# Patient Record
Sex: Female | Born: 1955 | Race: Black or African American | Hispanic: No | Marital: Single | Smoking: Never smoker
Health system: Southern US, Community
[De-identification: ages and names within clinical notes are randomized; demographics above are authoritative.]

## PROBLEM LIST (undated history)

## (undated) DIAGNOSIS — G4730 Sleep apnea, unspecified: Secondary | ICD-10-CM

## (undated) DIAGNOSIS — Z86718 Personal history of other venous thrombosis and embolism: Secondary | ICD-10-CM

## (undated) DIAGNOSIS — F32A Depression, unspecified: Secondary | ICD-10-CM

## (undated) DIAGNOSIS — C801 Malignant (primary) neoplasm, unspecified: Secondary | ICD-10-CM

## (undated) DIAGNOSIS — E785 Hyperlipidemia, unspecified: Secondary | ICD-10-CM

## (undated) DIAGNOSIS — M1990 Unspecified osteoarthritis, unspecified site: Secondary | ICD-10-CM

## (undated) DIAGNOSIS — K219 Gastro-esophageal reflux disease without esophagitis: Secondary | ICD-10-CM

## (undated) DIAGNOSIS — I10 Essential (primary) hypertension: Secondary | ICD-10-CM

## (undated) DIAGNOSIS — F329 Major depressive disorder, single episode, unspecified: Secondary | ICD-10-CM

## (undated) HISTORY — PX: CHOLECYSTECTOMY: SHX55

## (undated) HISTORY — DX: Essential (primary) hypertension: I10

## (undated) HISTORY — DX: Hyperlipidemia, unspecified: E78.5

## (undated) HISTORY — DX: Personal history of other venous thrombosis and embolism: Z86.718

## (undated) HISTORY — PX: BREAST BIOPSY: SHX20

## (undated) HISTORY — DX: Major depressive disorder, single episode, unspecified: F32.9

## (undated) HISTORY — PX: TONSILLECTOMY: SUR1361

## (undated) HISTORY — PX: TUBAL LIGATION: SHX77

## (undated) HISTORY — PX: PARTIAL HYSTERECTOMY: SHX80

## (undated) HISTORY — DX: Depression, unspecified: F32.A

## (undated) HISTORY — DX: Unspecified osteoarthritis, unspecified site: M19.90

---

## 2007-04-28 ENCOUNTER — Ambulatory Visit: Payer: Self-pay | Admitting: Nurse Practitioner

## 2007-05-03 ENCOUNTER — Ambulatory Visit: Payer: Self-pay | Admitting: *Deleted

## 2007-06-27 ENCOUNTER — Ambulatory Visit: Payer: Self-pay | Admitting: Nurse Practitioner

## 2007-07-13 ENCOUNTER — Ambulatory Visit: Payer: Self-pay | Admitting: Family Medicine

## 2007-08-02 ENCOUNTER — Ambulatory Visit: Payer: Self-pay | Admitting: Internal Medicine

## 2007-09-27 ENCOUNTER — Ambulatory Visit: Payer: Self-pay | Admitting: Nurse Practitioner

## 2007-10-11 ENCOUNTER — Ambulatory Visit: Payer: Self-pay | Admitting: Nurse Practitioner

## 2007-10-26 ENCOUNTER — Ambulatory Visit: Payer: Self-pay | Admitting: Nurse Practitioner

## 2007-11-08 ENCOUNTER — Encounter (INDEPENDENT_AMBULATORY_CARE_PROVIDER_SITE_OTHER): Payer: Self-pay | Admitting: Nurse Practitioner

## 2007-11-08 ENCOUNTER — Ambulatory Visit: Payer: Self-pay | Admitting: Family Medicine

## 2007-11-08 LAB — CONVERTED CEMR LAB
ALT: 26 units/L (ref 0–35)
AST: 27 units/L (ref 0–37)
Albumin: 4.1 g/dL (ref 3.5–5.2)
Alkaline Phosphatase: 71 units/L (ref 39–117)
Basophils Absolute: 0 10*3/uL (ref 0.0–0.1)
Basophils Relative: 1 % (ref 0–1)
Calcium: 9.1 mg/dL (ref 8.4–10.5)
Chloride: 106 meq/L (ref 96–112)
Creatinine, Ser: 0.86 mg/dL (ref 0.40–1.20)
MCHC: 32.7 g/dL (ref 30.0–36.0)
Neutro Abs: 2.1 10*3/uL (ref 1.7–7.7)
Neutrophils Relative %: 49 % (ref 43–77)
Potassium: 3.8 meq/L (ref 3.5–5.3)
RDW: 14.7 % (ref 11.5–15.5)

## 2007-12-14 ENCOUNTER — Ambulatory Visit: Payer: Self-pay | Admitting: Nurse Practitioner

## 2008-01-12 ENCOUNTER — Ambulatory Visit: Payer: Self-pay | Admitting: Family Medicine

## 2008-01-19 ENCOUNTER — Ambulatory Visit: Payer: Self-pay | Admitting: Family Medicine

## 2008-02-02 ENCOUNTER — Ambulatory Visit: Payer: Self-pay | Admitting: Internal Medicine

## 2008-02-23 ENCOUNTER — Ambulatory Visit: Payer: Self-pay | Admitting: Nurse Practitioner

## 2008-03-08 ENCOUNTER — Ambulatory Visit: Payer: Self-pay | Admitting: Internal Medicine

## 2008-04-04 ENCOUNTER — Ambulatory Visit: Payer: Self-pay | Admitting: Internal Medicine

## 2008-04-25 ENCOUNTER — Ambulatory Visit: Payer: Self-pay | Admitting: Internal Medicine

## 2008-05-23 ENCOUNTER — Ambulatory Visit: Payer: Self-pay | Admitting: Nurse Practitioner

## 2008-06-28 ENCOUNTER — Ambulatory Visit: Payer: Self-pay | Admitting: Internal Medicine

## 2008-07-16 ENCOUNTER — Ambulatory Visit: Payer: Self-pay | Admitting: Internal Medicine

## 2008-07-16 ENCOUNTER — Ambulatory Visit (HOSPITAL_COMMUNITY): Admission: RE | Admit: 2008-07-16 | Discharge: 2008-07-16 | Payer: Self-pay | Admitting: Internal Medicine

## 2008-07-26 ENCOUNTER — Ambulatory Visit: Payer: Self-pay | Admitting: Internal Medicine

## 2008-07-26 LAB — CONVERTED CEMR LAB: Prothrombin Time: 34.4 s — ABNORMAL HIGH (ref 11.6–15.2)

## 2008-08-23 ENCOUNTER — Ambulatory Visit: Payer: Self-pay | Admitting: Internal Medicine

## 2008-08-24 ENCOUNTER — Encounter (INDEPENDENT_AMBULATORY_CARE_PROVIDER_SITE_OTHER): Payer: Self-pay | Admitting: Internal Medicine

## 2008-08-24 LAB — CONVERTED CEMR LAB
INR: 3.4 — ABNORMAL HIGH (ref 0.0–1.5)
Prothrombin Time: 36.7 s — ABNORMAL HIGH (ref 11.6–15.2)

## 2008-09-05 ENCOUNTER — Ambulatory Visit: Payer: Self-pay | Admitting: Internal Medicine

## 2008-09-05 LAB — CONVERTED CEMR LAB: Prothrombin Time: 38 s — ABNORMAL HIGH (ref 11.6–15.2)

## 2008-09-14 ENCOUNTER — Ambulatory Visit: Payer: Self-pay | Admitting: Internal Medicine

## 2008-09-14 LAB — CONVERTED CEMR LAB: INR: 2.6 — ABNORMAL HIGH (ref 0.0–1.5)

## 2008-10-09 ENCOUNTER — Ambulatory Visit: Payer: Self-pay | Admitting: Family Medicine

## 2008-10-09 ENCOUNTER — Encounter (INDEPENDENT_AMBULATORY_CARE_PROVIDER_SITE_OTHER): Payer: Self-pay | Admitting: Internal Medicine

## 2008-10-23 ENCOUNTER — Ambulatory Visit: Payer: Self-pay | Admitting: Internal Medicine

## 2008-10-23 ENCOUNTER — Encounter (INDEPENDENT_AMBULATORY_CARE_PROVIDER_SITE_OTHER): Payer: Self-pay | Admitting: Internal Medicine

## 2008-10-23 LAB — CONVERTED CEMR LAB: INR: 1.8 — ABNORMAL HIGH (ref 0.0–1.5)

## 2008-11-06 ENCOUNTER — Ambulatory Visit: Payer: Self-pay | Admitting: Internal Medicine

## 2008-11-06 ENCOUNTER — Encounter (INDEPENDENT_AMBULATORY_CARE_PROVIDER_SITE_OTHER): Payer: Self-pay | Admitting: Internal Medicine

## 2008-11-06 LAB — CONVERTED CEMR LAB
INR: 1.8 — ABNORMAL HIGH (ref 0.0–1.5)
Prothrombin Time: 22.3 s — ABNORMAL HIGH (ref 11.6–15.2)

## 2008-11-20 ENCOUNTER — Encounter (INDEPENDENT_AMBULATORY_CARE_PROVIDER_SITE_OTHER): Payer: Self-pay | Admitting: Internal Medicine

## 2008-11-20 ENCOUNTER — Ambulatory Visit: Payer: Self-pay | Admitting: Internal Medicine

## 2008-11-20 LAB — CONVERTED CEMR LAB
INR: 3.3 — ABNORMAL HIGH (ref 0.0–1.5)
Prothrombin Time: 36.4 s — ABNORMAL HIGH (ref 11.6–15.2)

## 2008-12-20 ENCOUNTER — Ambulatory Visit: Payer: Self-pay | Admitting: Internal Medicine

## 2009-01-23 ENCOUNTER — Ambulatory Visit: Payer: Self-pay | Admitting: Internal Medicine

## 2009-02-20 ENCOUNTER — Ambulatory Visit: Payer: Self-pay | Admitting: Internal Medicine

## 2009-03-06 ENCOUNTER — Ambulatory Visit: Payer: Self-pay | Admitting: Internal Medicine

## 2009-03-21 ENCOUNTER — Ambulatory Visit: Payer: Self-pay | Admitting: Internal Medicine

## 2009-03-25 ENCOUNTER — Ambulatory Visit: Payer: Self-pay | Admitting: Internal Medicine

## 2009-03-25 ENCOUNTER — Encounter (INDEPENDENT_AMBULATORY_CARE_PROVIDER_SITE_OTHER): Payer: Self-pay | Admitting: Internal Medicine

## 2009-03-25 LAB — CONVERTED CEMR LAB
ALT: 27 units/L (ref 0–35)
AST: 28 units/L (ref 0–37)
Alkaline Phosphatase: 76 units/L (ref 39–117)
BUN: 21 mg/dL (ref 6–23)
Chloride: 103 meq/L (ref 96–112)
Creatinine, Ser: 0.84 mg/dL (ref 0.40–1.20)
Total Bilirubin: 0.4 mg/dL (ref 0.3–1.2)

## 2009-04-02 ENCOUNTER — Ambulatory Visit (HOSPITAL_COMMUNITY): Admission: RE | Admit: 2009-04-02 | Discharge: 2009-04-02 | Payer: Self-pay | Admitting: Family Medicine

## 2009-04-22 ENCOUNTER — Ambulatory Visit: Payer: Self-pay | Admitting: Internal Medicine

## 2009-04-25 ENCOUNTER — Encounter: Payer: Self-pay | Admitting: Internal Medicine

## 2009-04-25 ENCOUNTER — Ambulatory Visit: Payer: Self-pay | Admitting: Internal Medicine

## 2009-04-25 DIAGNOSIS — Z86718 Personal history of other venous thrombosis and embolism: Secondary | ICD-10-CM

## 2009-04-25 DIAGNOSIS — M545 Low back pain: Secondary | ICD-10-CM

## 2009-04-25 DIAGNOSIS — I10 Essential (primary) hypertension: Secondary | ICD-10-CM | POA: Insufficient documentation

## 2009-05-09 ENCOUNTER — Ambulatory Visit: Payer: Self-pay | Admitting: Internal Medicine

## 2009-07-01 ENCOUNTER — Ambulatory Visit: Payer: Self-pay | Admitting: Internal Medicine

## 2009-07-22 ENCOUNTER — Ambulatory Visit: Payer: Self-pay | Admitting: Internal Medicine

## 2009-08-26 ENCOUNTER — Ambulatory Visit: Payer: Self-pay | Admitting: Internal Medicine

## 2010-12-01 ENCOUNTER — Other Ambulatory Visit
Admission: RE | Admit: 2010-12-01 | Discharge: 2010-12-01 | Payer: Self-pay | Source: Home / Self Care | Admitting: Internal Medicine

## 2010-12-31 ENCOUNTER — Encounter
Admission: RE | Admit: 2010-12-31 | Discharge: 2010-12-31 | Payer: Self-pay | Source: Home / Self Care | Attending: Internal Medicine | Admitting: Internal Medicine

## 2011-01-07 ENCOUNTER — Encounter
Admission: RE | Admit: 2011-01-07 | Discharge: 2011-01-07 | Payer: Self-pay | Source: Home / Self Care | Attending: Internal Medicine | Admitting: Internal Medicine

## 2012-11-01 ENCOUNTER — Other Ambulatory Visit: Payer: Self-pay | Admitting: Internal Medicine

## 2012-11-01 DIAGNOSIS — R921 Mammographic calcification found on diagnostic imaging of breast: Secondary | ICD-10-CM

## 2012-11-28 ENCOUNTER — Other Ambulatory Visit: Payer: Self-pay | Admitting: Internal Medicine

## 2012-11-28 ENCOUNTER — Ambulatory Visit
Admission: RE | Admit: 2012-11-28 | Discharge: 2012-11-28 | Disposition: A | Discharge status: Home / Self Care | Payer: BC Managed Care – PPO | Source: Ambulatory Visit | Attending: Internal Medicine | Admitting: Internal Medicine

## 2012-11-28 DIAGNOSIS — R921 Mammographic calcification found on diagnostic imaging of breast: Secondary | ICD-10-CM

## 2012-12-05 ENCOUNTER — Other Ambulatory Visit: Payer: Self-pay | Admitting: Diagnostic Radiology

## 2012-12-05 ENCOUNTER — Ambulatory Visit
Admission: RE | Admit: 2012-12-05 | Discharge: 2012-12-05 | Disposition: A | Discharge status: Home / Self Care | Payer: BC Managed Care – PPO | Source: Ambulatory Visit | Attending: Internal Medicine | Admitting: Internal Medicine

## 2012-12-05 DIAGNOSIS — R921 Mammographic calcification found on diagnostic imaging of breast: Secondary | ICD-10-CM

## 2013-03-15 ENCOUNTER — Other Ambulatory Visit: Payer: Self-pay | Admitting: Occupational Medicine

## 2013-03-15 ENCOUNTER — Ambulatory Visit: Payer: Self-pay

## 2013-03-15 DIAGNOSIS — R52 Pain, unspecified: Secondary | ICD-10-CM

## 2013-08-31 ENCOUNTER — Other Ambulatory Visit (HOSPITAL_COMMUNITY): Payer: Self-pay | Admitting: Orthopedic Surgery

## 2013-08-31 ENCOUNTER — Ambulatory Visit (HOSPITAL_COMMUNITY)
Admission: RE | Admit: 2013-08-31 | Discharge: 2013-08-31 | Disposition: A | Discharge status: Home / Self Care | Payer: BC Managed Care – PPO | Source: Ambulatory Visit | Attending: Orthopedic Surgery | Admitting: Orthopedic Surgery

## 2013-08-31 DIAGNOSIS — M25512 Pain in left shoulder: Secondary | ICD-10-CM

## 2013-08-31 DIAGNOSIS — Z1389 Encounter for screening for other disorder: Secondary | ICD-10-CM | POA: Insufficient documentation

## 2013-08-31 DIAGNOSIS — M795 Residual foreign body in soft tissue: Secondary | ICD-10-CM | POA: Insufficient documentation

## 2013-11-03 ENCOUNTER — Other Ambulatory Visit: Payer: Self-pay

## 2013-11-03 DIAGNOSIS — Z1231 Encounter for screening mammogram for malignant neoplasm of breast: Secondary | ICD-10-CM

## 2013-11-30 ENCOUNTER — Ambulatory Visit: Payer: Self-pay

## 2014-01-01 ENCOUNTER — Ambulatory Visit
Admission: RE | Admit: 2014-01-01 | Discharge: 2014-01-01 | Disposition: A | Discharge status: Home / Self Care | Payer: Self-pay | Source: Ambulatory Visit

## 2014-01-01 DIAGNOSIS — Z1231 Encounter for screening mammogram for malignant neoplasm of breast: Secondary | ICD-10-CM

## 2014-08-22 ENCOUNTER — Other Ambulatory Visit: Payer: Self-pay | Admitting: Orthopedic Surgery

## 2014-08-22 DIAGNOSIS — R222 Localized swelling, mass and lump, trunk: Secondary | ICD-10-CM

## 2014-08-28 ENCOUNTER — Other Ambulatory Visit: Payer: Self-pay

## 2014-08-28 ENCOUNTER — Ambulatory Visit
Admission: RE | Admit: 2014-08-28 | Discharge: 2014-08-28 | Disposition: A | Discharge status: Home / Self Care | Payer: BC Managed Care – PPO | Source: Ambulatory Visit | Attending: Orthopedic Surgery | Admitting: Orthopedic Surgery

## 2014-08-28 DIAGNOSIS — R222 Localized swelling, mass and lump, trunk: Secondary | ICD-10-CM

## 2014-08-28 MED ORDER — IOHEXOL 300 MG/ML  SOLN
75.0000 mL | Freq: Once | INTRAMUSCULAR | Status: AC | PRN
  Administered 2014-08-28: 75 mL via INTRAVENOUS

## 2015-03-15 ENCOUNTER — Ambulatory Visit (INDEPENDENT_AMBULATORY_CARE_PROVIDER_SITE_OTHER): Payer: 59 | Admitting: Psychiatry

## 2015-03-15 ENCOUNTER — Encounter (HOSPITAL_COMMUNITY): Payer: Self-pay | Admitting: Psychiatry

## 2015-03-15 ENCOUNTER — Encounter (INDEPENDENT_AMBULATORY_CARE_PROVIDER_SITE_OTHER): Payer: Self-pay

## 2015-03-15 VITALS — BP 134/82 | HR 76 | Ht 66.0 in | Wt 243.0 lb

## 2015-03-15 DIAGNOSIS — F331 Major depressive disorder, recurrent, moderate: Secondary | ICD-10-CM
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
## 2015-03-18 ENCOUNTER — Telehealth (HOSPITAL_COMMUNITY): Payer: Self-pay

## 2015-03-18 ENCOUNTER — Other Ambulatory Visit (HOSPITAL_COMMUNITY): Payer: Self-pay | Admitting: Psychiatry

## 2015-03-18 DIAGNOSIS — F331 Major depressive disorder, recurrent, moderate: Secondary | ICD-10-CM

## 2015-03-18 MED ORDER — BUPROPION HCL ER (XL) 300 MG PO TB24: 300.0000 mg | ORAL_TABLET | ORAL | Status: DC

## 2015-04-04 ENCOUNTER — Encounter (HOSPITAL_COMMUNITY): Payer: Self-pay | Admitting: Psychiatry

## 2015-04-04 ENCOUNTER — Ambulatory Visit (INDEPENDENT_AMBULATORY_CARE_PROVIDER_SITE_OTHER): Payer: 59 | Admitting: Psychiatry

## 2015-04-04 VITALS — BP 134/88 | HR 98 | Ht 66.5 in | Wt 240.4 lb

## 2015-04-04 DIAGNOSIS — F331 Major depressive disorder, recurrent, moderate: Secondary | ICD-10-CM | POA: Diagnosis not present

## 2015-04-04 MED ORDER — CITALOPRAM HYDROBROMIDE 20 MG PO TABS: 20.0000 mg | ORAL_TABLET | Freq: Every day | ORAL | Status: DC

## 2015-04-04 MED ORDER — BUPROPION HCL ER (XL) 300 MG PO TB24: 300.0000 mg | ORAL_TABLET | ORAL | Status: DC
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
## 2015-04-30 ENCOUNTER — Encounter (HOSPITAL_COMMUNITY): Payer: Self-pay | Admitting: Clinical

## 2015-04-30 ENCOUNTER — Ambulatory Visit (INDEPENDENT_AMBULATORY_CARE_PROVIDER_SITE_OTHER): Payer: 59 | Admitting: Clinical

## 2015-04-30 DIAGNOSIS — F331 Major depressive disorder, recurrent, moderate: Secondary | ICD-10-CM
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
## 2015-05-16 ENCOUNTER — Telehealth (HOSPITAL_COMMUNITY): Payer: Self-pay | Admitting: *Deleted

## 2015-05-16 NOTE — Telephone Encounter (Signed)
Pt left message on nurses vm requesting refill.  Called pt back left message on her vm for call back.   Pt should have enough medications.

## 2015-05-17 ENCOUNTER — Telehealth (HOSPITAL_COMMUNITY): Payer: Self-pay

## 2015-05-17 NOTE — Telephone Encounter (Signed)
Telephone call with patient to follow up on message she left stating she needed a refill of Wellbutrin.  Patient reported she called her pharmacy and had a refill there on file.  Patient reminded of next appointment with Dr. Adele Schilder set for 06/05/15.

## 2015-05-20 ENCOUNTER — Ambulatory Visit (INDEPENDENT_AMBULATORY_CARE_PROVIDER_SITE_OTHER): Payer: 59 | Admitting: Clinical

## 2015-05-20 DIAGNOSIS — F331 Major depressive disorder, recurrent, moderate: Secondary | ICD-10-CM

## 2015-05-23 ENCOUNTER — Encounter (HOSPITAL_COMMUNITY): Payer: Self-pay | Admitting: Clinical

## 2015-05-26 NOTE — Progress Notes (Signed)
   THERAPIST PROGRESS NOTE  Session Time: 1:32 -2:28  Participation Level: Active  Behavioral Response: CasualAlertDepressed  Type of Therapy: Individual Therapy  Treatment Goals addressed: Improve psychiatric symptoms  Interventions: Motivational Interviewing and Mindfulness and Grounding techniques  Summary: Helen Hess is a 59 y.o. female who presents with Major depressive disorder, recurrent episode, moderate.   Suicidal/Homicidal: Nowithout intent/plan  Therapist Response:  Khadeeja met with clinician for an individual session. [name] Discussed her psychiatric symptoms. [name] shared that she has been tearful and sad most of the time since we last met. 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 She shared that she missed being around the children and hearing how they appreciated her. She shared how she had felt like she was helpful to them because she treated them with positive regard. She shared how she knew that a lot of them didn't get that very much. She cried when she shared that she was concerned that they would be missing that. Client and clinician discussed the idea of volunteering somewhere she could have that positive interaction with kids. Client stated that she didn't think she was ready.   [name] shared that she has isolated herself. She shared that she has not been around her children ( her own or those she had raised) or her grandchildren because she is afraid that they will worry about her if they saw her like this. She shared that she missed them a lot but didn't want to interfere in their lives. Clinician asked open ended questions about what it means to give support and to be supported. Clinician encouraged her to reach out to them.Clinician shared that interacting with others, especially those who love her would likely be helpful to her mental  health. Clinician introduced grounding and mindfulness techniques. Client and clinician practiced one together. [name] agreed to practice the technique until next session.  Plan: Return again in 1 weeks.  Diagnosis: Axis I: Major depressive disorder, recurrent episode, moderate.         [name] A, LCSW 05/26/2015

## 2015-05-27 ENCOUNTER — Ambulatory Visit (INDEPENDENT_AMBULATORY_CARE_PROVIDER_SITE_OTHER): Payer: BC Managed Care – PPO | Admitting: Clinical

## 2015-05-27 ENCOUNTER — Encounter (HOSPITAL_COMMUNITY): Payer: Self-pay | Admitting: Clinical

## 2015-05-27 DIAGNOSIS — F331 Major depressive disorder, recurrent, moderate: Secondary | ICD-10-CM

## 2015-06-03 ENCOUNTER — Ambulatory Visit (INDEPENDENT_AMBULATORY_CARE_PROVIDER_SITE_OTHER): Payer: 59 | Admitting: Clinical

## 2015-06-03 ENCOUNTER — Encounter (HOSPITAL_COMMUNITY): Payer: Self-pay

## 2015-06-03 ENCOUNTER — Encounter (HOSPITAL_COMMUNITY): Payer: Self-pay | Admitting: Clinical

## 2015-06-03 DIAGNOSIS — F331 Major depressive disorder, recurrent, moderate: Secondary | ICD-10-CM | POA: Diagnosis not present
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
## 2015-06-05 ENCOUNTER — Ambulatory Visit (HOSPITAL_COMMUNITY): Payer: Self-pay | Admitting: Psychiatry

## 2015-06-10 ENCOUNTER — Ambulatory Visit (INDEPENDENT_AMBULATORY_CARE_PROVIDER_SITE_OTHER): Payer: 59 | Admitting: Clinical

## 2015-06-10 ENCOUNTER — Encounter (HOSPITAL_COMMUNITY): Payer: Self-pay | Admitting: Clinical

## 2015-06-10 DIAGNOSIS — F331 Major depressive disorder, recurrent, moderate: Secondary | ICD-10-CM | POA: Diagnosis not present

## 2015-06-10 NOTE — Progress Notes (Signed)
THERAPIST PROGRESS NOTE  Session Time: 12:06 - 1:00  Participation Level: Active  Behavioral Response: CasualAlertDepressed  Type of Therapy: Individual Therapy  Treatment Goals addressed: improve psychiatric symptoms, improve unhelpful thinking, elevate mood, more positive outlook  Interventions: Cbt, Motivational Interviewing, Grounding and mindfulness techniques  Summary: Helen Hess is a 59 y.o. female who presents with Major depressive disorder, recurrent episode, moderate. .   Suicidal/Homicidal: No without intent/plan  Therapist Response: Khalessi met with clinician for an individual session. She discussed her current life events and her psychiatric symptoms. [name] shared that she continues to isolate. She shared that others go to the store for her. She shared that she finds it difficult to do much of anything. She shared that when it is not the depression it is the physical pain. [name] and clinician discussed her isolation and how it might be affecting her mood. [name] shared about her difficulty both in thought and body. [name] and clinician discussed grounding techniques. [name] was to practice this past week but she reported that she forgot. Client and clinician discussed the techniques and then practiced some of them. [name] shared some of her frustrating thoughts and client and clinician practiced another technique. Client and clinician discussed the practice, the purpose, and application. [name] said she would try to practice this week. Clinician made a goofy comment and [name] laugh and she smiled for the first time and said that it felt good to laugh.   Plan: Return again in 1 weeks.  Diagnosis:     Axis I: Major depressive disorder, recurrent episode, moderate.    Powell,Frances A, LCSW 06/10/2015    

## 2015-06-14 ENCOUNTER — Ambulatory Visit (HOSPITAL_COMMUNITY): Payer: Self-pay | Admitting: Psychiatry

## 2015-06-25 NOTE — Progress Notes (Signed)
Opened in Error.

## 2015-06-28 ENCOUNTER — Encounter (HOSPITAL_COMMUNITY): Payer: Self-pay | Admitting: Emergency Medicine

## 2015-06-28 ENCOUNTER — Emergency Department (HOSPITAL_COMMUNITY)
Admission: EM | Admit: 2015-06-28 | Discharge: 2015-06-28 | Disposition: A | Discharge status: Home / Self Care | Payer: 59 | Attending: Emergency Medicine | Admitting: Emergency Medicine

## 2015-06-28 DIAGNOSIS — Z7901 Long term (current) use of anticoagulants: Secondary | ICD-10-CM | POA: Diagnosis not present

## 2015-06-28 DIAGNOSIS — E785 Hyperlipidemia, unspecified: Secondary | ICD-10-CM | POA: Insufficient documentation

## 2015-06-28 DIAGNOSIS — Z87891 Personal history of nicotine dependence: Secondary | ICD-10-CM | POA: Diagnosis not present

## 2015-06-28 DIAGNOSIS — Z79899 Other long term (current) drug therapy: Secondary | ICD-10-CM | POA: Diagnosis not present

## 2015-06-28 DIAGNOSIS — F329 Major depressive disorder, single episode, unspecified: Secondary | ICD-10-CM | POA: Diagnosis not present

## 2015-06-28 DIAGNOSIS — I10 Essential (primary) hypertension: Secondary | ICD-10-CM | POA: Diagnosis not present

## 2015-06-28 DIAGNOSIS — R040 Epistaxis: Secondary | ICD-10-CM | POA: Diagnosis present

## 2015-06-28 DIAGNOSIS — M1990 Unspecified osteoarthritis, unspecified site: Secondary | ICD-10-CM | POA: Diagnosis not present

## 2015-06-28 MED ORDER — OXYMETAZOLINE HCL 0.05 % NA SOLN
1.0000 | Freq: Once | NASAL | Status: AC
  Administered 2015-06-28: 09:00:00 via NASAL

## 2015-06-28 MED ORDER — OXYMETAZOLINE HCL 0.05 % NA SOLN
NASAL | Status: AC
  Filled 2015-06-28: qty 15

## 2015-06-28 NOTE — ED Notes (Signed)
MD at bedside. 

## 2015-06-28 NOTE — Discharge Instructions (Signed)
If you were given medicines take as directed.  If you are on coumadin or contraceptives realize their levels and effectiveness is altered by many different medicines.  If you have any reaction (rash, tongues swelling, other) to the medicines stop taking and see a physician.    If your blood pressure was elevated in the ER make sure you follow up for management with a primary doctor or return for chest pain, shortness of breath or stroke symptoms.  Please follow up as directed and return to the ER or see a physician for new or worsening symptoms.  Thank you. Filed Vitals:   06/28/15 0840  BP: 135/86  Pulse: 88  Temp: 98.2 F (36.8 C)  TempSrc: Oral  Resp: 16  SpO2: 96%

## 2015-06-28 NOTE — ED Notes (Signed)
Per patient, states nose bleeds since yesterday, increases with cough, sneezing

## 2015-06-28 NOTE — ED Provider Notes (Signed)
CSN: [id number]     Arrival date & time 06/28/15  7654 History   First MD Initiated Contact with Patient 06/28/15 0830     Chief Complaint  Patient presents with  . nose bleeds      (Consider location/radiation/quality/duration/timing/severity/associated sxs/prior Treatment) HPI Comments: 59 year old female with history of high blood pressure, DVT, on Coumadin last INR yesterday 2.1 presents after second episode of epistaxis. Started in the right nostril than the left. No history of similar, no injuries. No syncope or other symptoms. Patient coughed up small amount of blood that was draining in the back of her throat.  The history is provided by the patient.    Past Medical History  Diagnosis Date  . Hypertension   . H/O blood clots   . Depression   . Hyperlipemia   . Arthritis    Past Surgical History  Procedure Laterality Date  . Partial hysterectomy    . Cholecystectomy    . Tonsillectomy      child  . Tubal ligation     Family History  Problem Relation Age of Onset  . Bipolar disorder Cousin     Maternal side   . Depression Maternal Aunt   . Schizophrenia Brother   . Schizophrenia Brother    History  Substance Use Topics  . Smoking status: Former Research scientist (life sciences)  . Smokeless tobacco: Never Used     Comment: Quit 2 months ago  . Alcohol Use: No   OB History    No data available     Review of Systems  Constitutional: Negative for fever and chills.  HENT: Positive for nosebleeds. Negative for congestion.   Eyes: Negative for visual disturbance.  Respiratory: Negative for shortness of breath.   Cardiovascular: Negative for chest pain.  Gastrointestinal: Negative for vomiting and abdominal pain.  Genitourinary: Negative for dysuria and flank pain.  Musculoskeletal: Negative for back pain, neck pain and neck stiffness.  Skin: Negative for rash.  Neurological: Negative for light-headedness and headaches.      Allergies  Tramadol  Home Medications   Prior to  Admission medications   Medication Sig Start Date End Date Taking? Authorizing Provider  amLODipine (NORVASC) 5 MG tablet Take 5 mg by mouth daily.  02/21/15  Yes Historical Provider, MD  buPROPion (WELLBUTRIN XL) 300 MG 24 hr tablet Take 1 tablet (300 mg total) by mouth every morning. 04/04/15 04/03/16 Yes Kathlee Nations, MD  citalopram (CELEXA) 20 MG tablet Take 1 tablet (20 mg total) by mouth daily. 04/04/15  Yes Kathlee Nations, MD  cyclobenzaprine (FLEXERIL) 10 MG tablet Take 10 mg by mouth 3 (three) times daily as needed for muscle spasms.  02/21/15  Yes Historical Provider, MD  HYDROcodone-acetaminophen (NORCO/VICODIN) 5-325 MG per tablet Take 1 tablet by mouth 3 (three) times daily.   Yes Historical Provider, MD  pantoprazole (PROTONIX) 40 MG tablet Take 1 tablet by mouth daily. 06/06/15  Yes Historical Provider, MD  QUEtiapine (SEROQUEL) 25 MG tablet Take 1 tablet by mouth at bedtime. 06/06/15  Yes Historical Provider, MD  simvastatin (ZOCOR) 20 MG tablet 20 mg. 02/21/15  Yes Historical Provider, MD  valsartan-hydrochlorothiazide (DIOVAN-HCT) 160-12.5 MG per tablet Take 160 tablets by mouth daily.   Yes Historical Provider, MD  warfarin (COUMADIN) 6 MG tablet Take 3 mg by mouth daily. 06/06/15  Yes Historical Provider, MD   BP 135/86 mmHg  Pulse 88  Temp(Src) 98.2 F (36.8 C) (Oral)  Resp 16  SpO2 96% Physical Exam  Constitutional:  She is oriented to person, place, and time. She appears well-developed and well-nourished.  HENT:  Head: Normocephalic and atraumatic.  No active bleeding or obvious anterior source. No blood posterior pharynx, moist mucous membranes.  Eyes: Right eye exhibits no discharge. Left eye exhibits no discharge.  Neck: Normal range of motion. Neck supple. No tracheal deviation present.  Cardiovascular: Normal rate and regular rhythm.   Pulmonary/Chest: Effort normal.  Neurological: She is alert and oriented to person, place, and time.  Skin: Skin is warm. No rash noted.   Psychiatric: She has a normal mood and affect.  Nursing note and vitals reviewed.   ED Course  Procedures (including critical care time) Labs Review Labs Reviewed - No data to display  Imaging Review No results found.   EKG Interpretation None      MDM   Final diagnoses:  Epistaxis  Anticoagulant long-term use   Patient on Coumadin presents after second episode of epistaxis. No active bleeding visualized. Patient well-appearing normal vital signs. Afrin spray utilized, observation the ER. Discussed follow-up with ENT and reasons to return over the weekend if bleeding worsened. Patient had recent INR done 2.1 she has a result.  On recheck no active bleeding. Discussed supportive care and reasons to return. Results and differential diagnosis were discussed with the patient/parent/guardian. Xrays were independently reviewed by myself.  Close follow up outpatient was discussed, comfortable with the plan.   Medications  oxymetazoline (AFRIN) 0.05 % nasal spray 1 spray ( Each Nare Given 06/28/15 0839)    Filed Vitals:   06/28/15 0840  BP: 135/86  Pulse: 88  Temp: 98.2 F (36.8 C)  TempSrc: Oral  Resp: 16  SpO2: 96%    Final diagnoses:  Epistaxis  Anticoagulant long-term use      Elnora Morrison, MD 06/28/15 1012

## 2015-07-02 ENCOUNTER — Ambulatory Visit (HOSPITAL_BASED_OUTPATIENT_CLINIC_OR_DEPARTMENT_OTHER): Payer: Self-pay

## 2015-07-02 ENCOUNTER — Encounter (HOSPITAL_COMMUNITY): Payer: Self-pay | Admitting: Clinical

## 2015-07-02 ENCOUNTER — Ambulatory Visit (INDEPENDENT_AMBULATORY_CARE_PROVIDER_SITE_OTHER): Payer: 59 | Admitting: Clinical

## 2015-07-02 DIAGNOSIS — F331 Major depressive disorder, recurrent, moderate: Secondary | ICD-10-CM

## 2015-07-02 NOTE — Progress Notes (Signed)
   THERAPIST PROGRESS NOTE  Session Time: 9:03 -9:45  Participation Level: Active  Behavioral Response: NeatDrowsyDepressed  Type of Therapy: Individual Therapy  Treatment Goals addressed: improve psychiatric symptoms,  Interventions: Cbt, Motivational Interviewing, Grounding and mindfulness techniques  Summary: Helen Hess is a 59 y.o. female who presents with Major depressive disorder, recurrent episode, moderate. .   Suicidal/Homicidal: No without intent/plan  Therapist Response: Coretta met with clinician for an individual session. She discussed her current life events and her psychiatric symptoms. Murna shared that she continues to feel depressed, isolate, and sleep a lot. Atlee shared that her son brought her grandchildren to stay with her over the weekend. She shared that he thought they would help her get out of her depression (Lavine loves her grandchildren) but she reports her routine stayed much the same. She shared that she paid another to take them out in [place]. Lotta shared that she didn't like the kids seeing her that way and she didn't have the energy to do it different.  Teryn and clinician discussed how over sleeping can make it more difficult to break the depressive cycle.Moksha and clinician discussed scheduling and how it could help improve her mood. Client and clinician discussed the possibility of putting some small activities in her week so that she has something to look forward to. Leimomi shared that she would like to learn how to crochet. Shekela agreed to call the local yarn shops to see about classes. Client and clinician discussed some other places that might offer crochet lessons or other activities she might enjoy. Client and clinician practiced a grounding technique together. Kosha shared that she had not practiced because her memory is not working well right now. Clinician made her a post it as a reminder if she chose to practice. Jakia said she would  like to try.   Plan: Return again in 1 weeks.  Diagnosis:     Axis I: Major depressive disorder, recurrent episode, moderate.   Marin Wisner A, LCSW 07/02/2015

## 2015-07-04 NOTE — Addendum Note (Signed)
Addended by: Francee Piccolo A on: 07/04/2015 06:00 PM   Modules accepted: Level of Service

## 2015-07-05 ENCOUNTER — Ambulatory Visit (INDEPENDENT_AMBULATORY_CARE_PROVIDER_SITE_OTHER): Payer: 59 | Admitting: Psychiatry

## 2015-07-05 ENCOUNTER — Encounter (HOSPITAL_COMMUNITY): Payer: Self-pay | Admitting: Psychiatry

## 2015-07-05 VITALS — BP 107/73 | HR 100 | Ht 66.0 in | Wt 237.0 lb

## 2015-07-05 DIAGNOSIS — F331 Major depressive disorder, recurrent, moderate: Secondary | ICD-10-CM | POA: Diagnosis not present

## 2015-07-05 MED ORDER — CITALOPRAM HYDROBROMIDE 20 MG PO TABS: 20.0000 mg | ORAL_TABLET | Freq: Every day | ORAL | Status: AC

## 2015-07-05 MED ORDER — BUPROPION HCL ER (XL) 300 MG PO TB24: 300.0000 mg | ORAL_TABLET | ORAL | Status: AC
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
## 2015-07-29 ENCOUNTER — Ambulatory Visit (HOSPITAL_COMMUNITY): Payer: Self-pay | Admitting: Clinical

## 2015-09-17 ENCOUNTER — Ambulatory Visit
Admission: RE | Admit: 2015-09-17 | Discharge: 2015-09-17 | Disposition: A | Discharge status: Home / Self Care | Payer: 59 | Source: Ambulatory Visit | Attending: Internal Medicine | Admitting: Internal Medicine

## 2015-09-17 ENCOUNTER — Other Ambulatory Visit: Payer: Self-pay | Admitting: Internal Medicine

## 2015-09-17 DIAGNOSIS — Z7901 Long term (current) use of anticoagulants: Secondary | ICD-10-CM

## 2015-10-07 ENCOUNTER — Ambulatory Visit (HOSPITAL_COMMUNITY): Payer: Self-pay | Admitting: Psychiatry

## 2015-10-17 ENCOUNTER — Other Ambulatory Visit: Payer: Self-pay | Admitting: Internal Medicine

## 2015-10-17 DIAGNOSIS — Z1231 Encounter for screening mammogram for malignant neoplasm of breast: Secondary | ICD-10-CM

## 2015-10-30 ENCOUNTER — Ambulatory Visit (HOSPITAL_COMMUNITY): Payer: Self-pay | Admitting: Psychiatry

## 2015-11-05 ENCOUNTER — Ambulatory Visit
Admission: RE | Admit: 2015-11-05 | Discharge: 2015-11-05 | Disposition: A | Discharge status: Home / Self Care | Payer: 59 | Source: Ambulatory Visit | Attending: Internal Medicine | Admitting: Internal Medicine

## 2015-11-05 DIAGNOSIS — Z1231 Encounter for screening mammogram for malignant neoplasm of breast: Secondary | ICD-10-CM

## 2015-11-06 ENCOUNTER — Ambulatory Visit: Payer: Self-pay

## 2015-11-09 ENCOUNTER — Other Ambulatory Visit (HOSPITAL_COMMUNITY): Payer: Self-pay | Admitting: Psychiatry

## 2015-11-10 ENCOUNTER — Other Ambulatory Visit (HOSPITAL_COMMUNITY): Payer: Self-pay | Admitting: Psychiatry

## 2015-11-15 ENCOUNTER — Other Ambulatory Visit (HOSPITAL_COMMUNITY): Payer: Self-pay | Admitting: Psychiatry

## 2015-11-18 ENCOUNTER — Telehealth (HOSPITAL_COMMUNITY): Payer: Self-pay

## 2015-11-18 NOTE — Telephone Encounter (Signed)
[facility] management - Telephone call message left to follow up on message patient left requesting refills of  medications.  Informed patient she needs to call back to schedule an appt.  Last seen 07/05/15, cancelled 10/30/15 with no follow currently.  Left patient a message this request would be sent to Dr. Adele Schilder but she would need to call to set up first available appointment.

## 2015-11-19 NOTE — Telephone Encounter (Signed)
She needs to be seen.

## 2015-11-26 NOTE — Telephone Encounter (Signed)
Left patient a message she would need to call to set up an appointment for any further refills as has not been evaluated since 07/05/15.

## 2016-03-31 NOTE — Progress Notes (Signed)
Electrophysiology Office Note   Date:  04/01/2016   ID:  Helen Hess, DOB Apr 23, 1956, MRN [id number]  PCP:  Helen Low, MD   Primary Electrophysiologist:  Helen Haw, MD    Chief Complaint  Patient presents with  . Advice Only  . Tachycardia     History of Present Illness: Helen Hess is a 60 y.o. female who presents today for electrophysiology evaluation.   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  She says that she has been feeling well without any symptoms. She says that when she had her vitals taken in her primary doctor's office, it was noted that her heart rate was in the Hess 100s to 120. She has had no palpitations, chest pain, shortness of breath, or skipped heart beats.    Today, she denies symptoms of palpitations, chest pain, shortness of breath, orthopnea, PND, lower extremity edema, claudication, dizziness, presyncope, syncope, bleeding, or neurologic sequela. The patient is tolerating medications without difficulties and is otherwise without complaint today.    Past Medical History  Diagnosis Date  . Hypertension   . H/O blood clots   . Depression   . Hyperlipemia   . Arthritis    Past Surgical History  Procedure Laterality Date  . Partial hysterectomy    . Cholecystectomy    . Tonsillectomy      child  . Tubal ligation       Current Outpatient Prescriptions  Medication Sig Dispense Refill  . amLODipine (NORVASC) 5 MG tablet Take 5 mg by mouth daily.     Marland Kitchen buPROPion (WELLBUTRIN XL) 300 MG 24 hr tablet Take 1 tablet (300 mg total) by mouth every morning. 30 tablet 2  . citalopram (CELEXA) 20 MG tablet Take 1 tablet (20 mg total) by mouth daily. 30 tablet 2  . cyclobenzaprine (FLEXERIL) 10 MG tablet Take 10 mg by mouth 3 (three) times daily as needed for muscle spasms.     Marland Kitchen HYDROcodone-acetaminophen (NORCO/VICODIN) 5-325 MG per tablet Take 1 tablet by mouth 3 (three) times daily.    .  pantoprazole (PROTONIX) 40 MG tablet Take 1 tablet by mouth daily.    . rivaroxaban (XARELTO) 20 MG TABS tablet Take 20 mg by mouth daily with supper.    . simvastatin (ZOCOR) 20 MG tablet 20 mg.    . valsartan-hydrochlorothiazide (DIOVAN-HCT) 160-12.5 MG per tablet Take 160 tablets by mouth daily.     No current facility-administered medications for this visit.    Allergies:   Tramadol   Social History:  The patient  reports that she has quit smoking. She has never used smokeless tobacco. She reports that she does not drink alcohol or use illicit drugs.   Family History:  The patient's family history includes Bipolar disorder in her cousin; Breast cancer in her mother; Depression in her maternal aunt; Heart attack in her mother; Schizophrenia in her brother and brother.    ROS:  Please see the history of present illness.   Otherwise, review of systems is positive for leg pain, snoring, cough, back and muscle pain, dizziness, easy bruising.   All other systems are reviewed and negative.    PHYSICAL EXAM: VS:  Ht 5\' 6"  (1.676 m)  Wt 217 lb 12.8 oz (98.793 kg)  BMI 35.17 kg/m2 , BMI Body mass index is 35.17 kg/(m^2). GEN: Well nourished, well developed, in no acute distress HEENT: normal Neck: no JVD, carotid bruits, or masses Cardiac:  RRR; no murmurs, rubs, or gallops,no edema  Respiratory:  clear to auscultation bilaterally, normal work of breathing GI: soft, nontender, nondistended, + BS MS: no deformity or atrophy Skin: warm and dry Neuro:  Strength and sensation are intact Psych: euthymic mood, full affect  EKG:  EKG is ordered today. The ekg ordered today shows sinus rhythm, voltage criteria for LVH, nonspecific T wave flattening  Recent Labs: No results found for requested labs within last 365 days.    Lipid Panel  No results found for: CHOL, TRIG, HDL, CHOLHDL, VLDL, LDLCALC, LDLDIRECT   Wt Readings from Last 3 Encounters:  04/01/16 217 lb 12.8 oz (98.793 kg)    07/05/15 237 lb (107.502 kg)  04/04/15 240 lb 6.4 oz (109.045 kg)      Other studies Reviewed: Additional studies/ records that were reviewed today include: PCP records  ASSESSMENT AND PLAN:  1.  Tachycardia: In looking at her EKG comparing to the EKG that she had primary physician's office, she is in sinus rhythm today with a heart rate of 85. EKG from her primary physician's office shows likely sinus tachycardia versus an atrial tachycardia. The patient doesn't have any symptoms currently, and has not had palpitations, shortness of breath, or chest pain area due to that, we Helen Hess continue to monitor her and see her back on an as-needed basis. If she does become symptomatic with her palpitations, I have instructed her to call the clinic back and we Helen Hess monitor her further.    Current medicines are reviewed at length with the patient today.   The patient does not have concerns regarding her medicines.  The following changes were made today:    Labs/ tests ordered today include:  No orders of the defined types were placed in this encounter.     Disposition:   FU with Helen Hess PRN  Signed, Helen Mormino Meredith Leeds, MD  04/01/2016 10:59 AM     [facility] [address] [place] [address] [phone] (office) [phone] (fax)

## 2016-04-01 ENCOUNTER — Ambulatory Visit (INDEPENDENT_AMBULATORY_CARE_PROVIDER_SITE_OTHER): Payer: BLUE CROSS/BLUE SHIELD | Admitting: Cardiology

## 2016-04-01 ENCOUNTER — Encounter: Payer: Self-pay | Admitting: Cardiology

## 2016-04-01 VITALS — BP 118/90 | HR 85 | Ht 66.0 in | Wt 217.8 lb

## 2016-04-01 DIAGNOSIS — R000 Tachycardia, unspecified: Secondary | ICD-10-CM

## 2016-04-01 NOTE — Patient Instructions (Signed)
Medication Instructions:  Your physician recommends that you continue on your current medications as directed. Please refer to the Current Medication list given to you today.  Labwork: None ordered  Testing/Procedures: None ordered  Follow-Up: To be determined once Dr. Curt Bears has reviewed the EKG from Dr. Lysle Rubens  If you need a refill on your cardiac medications before your next appointment, please call your pharmacy.  Thank you for choosing [facility]!!   Trinidad Curet, RN [phone]

## 2016-07-10 ENCOUNTER — Emergency Department (HOSPITAL_COMMUNITY)
Admission: EM | Admit: 2016-07-10 | Discharge: 2016-07-10 | Disposition: A | Discharge status: Home / Self Care | Payer: Medicaid Other | Attending: Emergency Medicine | Admitting: Emergency Medicine

## 2016-07-10 ENCOUNTER — Emergency Department (HOSPITAL_COMMUNITY): Payer: Medicaid Other

## 2016-07-10 ENCOUNTER — Encounter (HOSPITAL_COMMUNITY): Payer: Self-pay

## 2016-07-10 DIAGNOSIS — Y939 Activity, unspecified: Secondary | ICD-10-CM | POA: Diagnosis not present

## 2016-07-10 DIAGNOSIS — M546 Pain in thoracic spine: Secondary | ICD-10-CM | POA: Diagnosis present

## 2016-07-10 DIAGNOSIS — I10 Essential (primary) hypertension: Secondary | ICD-10-CM | POA: Insufficient documentation

## 2016-07-10 DIAGNOSIS — Y929 Unspecified place or not applicable: Secondary | ICD-10-CM | POA: Insufficient documentation

## 2016-07-10 DIAGNOSIS — W010XXA Fall on same level from slipping, tripping and stumbling without subsequent striking against object, initial encounter: Secondary | ICD-10-CM | POA: Insufficient documentation

## 2016-07-10 DIAGNOSIS — S161XXA Strain of muscle, fascia and tendon at neck level, initial encounter: Secondary | ICD-10-CM | POA: Insufficient documentation

## 2016-07-10 DIAGNOSIS — Z79899 Other long term (current) drug therapy: Secondary | ICD-10-CM | POA: Diagnosis not present

## 2016-07-10 DIAGNOSIS — Y999 Unspecified external cause status: Secondary | ICD-10-CM | POA: Insufficient documentation

## 2016-07-10 DIAGNOSIS — M549 Dorsalgia, unspecified: Secondary | ICD-10-CM

## 2016-07-10 DIAGNOSIS — Z87891 Personal history of nicotine dependence: Secondary | ICD-10-CM | POA: Insufficient documentation

## 2016-07-10 MED ORDER — NAPROXEN 500 MG PO TABS: 500.0000 mg | ORAL_TABLET | Freq: Two times a day (BID) | ORAL | 0 refills | Status: DC

## 2016-07-10 MED ORDER — METHOCARBAMOL 500 MG PO TABS: 500.0000 mg | ORAL_TABLET | Freq: Two times a day (BID) | ORAL | 0 refills | Status: AC

## 2016-07-10 NOTE — ED Provider Notes (Signed)
[place] DEPT Provider Note   CSN: [id number] Arrival date & time: 07/10/16  1529  First Provider Contact:   First MD Initiated Contact with Patient 07/10/16 1619       By signing my name below, I, [name], attest that this documentation has been prepared under the direction and in the presence of  Vahe Pienta, PA-C. Electronically Signed: [name], ED Scribe. 07/10/16. 4:28 PM.   History   Chief Complaint Chief Complaint  Patient presents with  . Fall  . Neck Pain  . Back Pain  . Arm Pain    HPI Helen Hess is a 60 y.o. female who presents to the Emergency Department complaining of moderate, gradually worsening neck pain s/p mechanical fall that occurred at 1:40PM. Pt states that she tripped over a package of water bottles and fell forward onto her stomach and arms. She denies LOC or head injury. She also complains of bilateral shoulder pain, bilateral arm pain, upper back pain, HA, chest soreness. Pt is ambulatory without difficulty. She reports her pain is worsened with movement, deep breathing and in certain positions. Pt denies taking OTC medications at home to improve symptoms. She denies numbness or paresthesia to BUE, nausea, emesis, visual disturbance, dizziness, SOB, abdominal pain, additional injuries.   The history is provided by the patient. No language interpreter was used.    Past Medical History:  Diagnosis Date  . Arthritis   . Depression   . H/O blood clots   . Hyperlipemia   . Hypertension     Patient Active Problem List   Diagnosis Date Noted  . HYPERTENSION 04/25/2009  . LOW BACK PAIN, ACUTE 04/25/2009  . DVT, HX OF 04/25/2009    Past Surgical History:  Procedure Laterality Date  . CHOLECYSTECTOMY    . PARTIAL HYSTERECTOMY    . TONSILLECTOMY     child  . TUBAL LIGATION      OB History    No data available       Home Medications    Prior to Admission medications   Medication Sig Start Date End Date Taking? Authorizing  Provider  amLODipine (NORVASC) 5 MG tablet Take 5 mg by mouth daily.  02/21/15   Historical Provider, MD  buPROPion (WELLBUTRIN XL) 300 MG 24 hr tablet Take 1 tablet (300 mg total) by mouth every morning. 07/05/15 07/04/16  Kathlee Nations, MD  citalopram (CELEXA) 20 MG tablet Take 1 tablet (20 mg total) by mouth daily. 07/05/15   Kathlee Nations, MD  cyclobenzaprine (FLEXERIL) 10 MG tablet Take 10 mg by mouth 3 (three) times daily as needed for muscle spasms.  02/21/15   Historical Provider, MD  HYDROcodone-acetaminophen (NORCO/VICODIN) 5-325 MG per tablet Take 1 tablet by mouth 3 (three) times daily.    Historical Provider, MD  pantoprazole (PROTONIX) 40 MG tablet Take 1 tablet by mouth daily. 06/06/15   Historical Provider, MD  rivaroxaban (XARELTO) 20 MG TABS tablet Take 20 mg by mouth daily with supper.    Historical Provider, MD  simvastatin (ZOCOR) 20 MG tablet 20 mg. 02/21/15   Historical Provider, MD  valsartan-hydrochlorothiazide (DIOVAN-HCT) 160-12.5 MG per tablet Take 160 tablets by mouth daily.    Historical Provider, MD    Family History Family History  Problem Relation Age of Onset  . Schizophrenia Brother   . Schizophrenia Brother   . Breast cancer Mother   . Heart attack Mother   . Bipolar disorder Cousin     Maternal side   .  Depression Maternal Aunt     Social History Social History  Substance Use Topics  . Smoking status: Former Research scientist (life sciences)  . Smokeless tobacco: Never Used     Comment: Quit 2 months ago  . Alcohol use No     Allergies   Tramadol   Review of Systems Review of Systems  Eyes: Negative for visual disturbance.  Respiratory: Negative for shortness of breath.   Cardiovascular: Positive for chest pain.  Gastrointestinal: Negative for abdominal pain, nausea and vomiting.  Musculoskeletal: Positive for arthralgias, back pain and neck pain.  Neurological: Positive for headaches. Negative for numbness.    Physical Exam Updated Vital Signs BP 123/87 (BP  Location: Left Arm)   Pulse (!) 127   Temp 98.9 F (37.2 C) (Oral)   SpO2 99%   Physical Exam  Constitutional: She is oriented to person, place, and time. She appears well-developed and well-nourished.  HENT:  Head: Normocephalic and atraumatic.  Eyes: Conjunctivae are normal.  Glass eye on the left but pupil equal and reactive on the right.   Neck: Normal range of motion.  Cardiovascular: Normal rate and regular rhythm.   2+ radial and DP pulses bilaterally.   Pulmonary/Chest: Effort normal and breath sounds normal. No respiratory distress.  Abdominal: Soft. She exhibits no distension. There is no tenderness.  Musculoskeletal: Normal range of motion. She exhibits tenderness. She exhibits no deformity.       Thoracic back: She exhibits normal range of motion, no tenderness, no bony tenderness and no swelling.       Lumbar back: She exhibits normal range of motion, no tenderness, no bony tenderness and no swelling.  TTP of the trapezius musculature bilaterally. TTP of the cervical spine. No step offs or deformities. FROM of BUE.   Neurological: She is alert and oriented to person, place, and time. No cranial nerve deficit. Coordination normal.  Cranial nerves 2-12 grossly intact. Normal gait.   Skin: Skin is warm and dry.  Psychiatric: She has a normal mood and affect. Her behavior is normal.  Nursing note and vitals reviewed.    ED Treatments / Results  Labs (all labs ordered are listed, but only abnormal results are displayed) Labs Reviewed - No data to display  EKG  EKG Interpretation None       Radiology No results found.  Procedures Procedures (including critical care time)  DIAGNOSTIC STUDIES: Oxygen Saturation is 99% on RA, normal by my interpretation.    COORDINATION OF CARE: 4:25 PM Discussed treatment plan with pt at bedside which includes XR and pt agreed to plan.   Medications Ordered in ED Medications - No data to display   Initial Impression /  Assessment and Plan / ED Course  I have reviewed the triage vital signs and the nursing notes.  Pertinent labs & imaging results that were available during my care of the patient were reviewed by me and considered in my medical decision making (see chart for details).  Clinical Course    Patient without signs of serious head, neck, or back injury. Normal neurological exam. No concern for closed head injury, lung injury, or intraabdominal injury. Normal muscle soreness after fall. Due to pts normal radiology & ability to ambulate in ED pt will be dc home with symptomatic therapy. Pt has been instructed to follow up with their doctor if symptoms persist. Home conservative therapies for pain including ice and heat tx have been discussed. Pt is hemodynamically stable, in NAD, & able to ambulate in  the ED. Return precautions discussed.   Final Clinical Impressions(s) / ED Diagnoses   Final diagnoses:  None    New Prescriptions New Prescriptions   No medications on file    I personally performed the services described in this documentation, which was scribed in my presence. The recorded information has been reviewed and is accurate.    Hyman Bible, PA-C 07/10/16 2039    Tanna Furry, MD 07/22/16 [phone]

## 2016-07-10 NOTE — ED Notes (Addendum)
Pt states she tripped over water bottles that were in the aisle at [facility]. Pt c/o neck, small headache , both shoulders and upper back which feels like a burning pain. Both arms hurt too. Pain is a 8/10. Pt did not lose any conciousness. Pt stated this happened about 1:40pm to day. Offered pt ice packs. Pt was in agreement. Pt returned from the x-ray dept. Pt now states her upper chest hurts when she coughs. PA aware and examined the pt. Pt is sore to the touch when the PA pressed on her chest. A CXR was offered but pt did decline. PA aware of elevated heart rate. Pt given RX and discharge instructions. She took ice packs with her. Pt requested to be taken out of ER in a WC and then wanted security to drive her to her car.

## 2016-07-10 NOTE — ED Triage Notes (Addendum)
Pt c/o neck, upper back, and bilateral arm pain r/t "tripping over some water bottles" this afternoon.  Pain score 8/10.  Pt reports falling "face first" and trying to catch herself.  Denies hitting head and LOC.

## 2016-09-14 ENCOUNTER — Other Ambulatory Visit: Payer: Self-pay | Admitting: Internal Medicine

## 2016-09-14 DIAGNOSIS — Z1231 Encounter for screening mammogram for malignant neoplasm of breast: Secondary | ICD-10-CM

## 2016-11-09 ENCOUNTER — Ambulatory Visit
Admission: RE | Admit: 2016-11-09 | Discharge: 2016-11-09 | Disposition: A | Discharge status: Home / Self Care | Payer: Medicare Other | Source: Ambulatory Visit | Attending: Internal Medicine | Admitting: Internal Medicine

## 2016-11-09 DIAGNOSIS — Z1231 Encounter for screening mammogram for malignant neoplasm of breast: Secondary | ICD-10-CM

## 2016-12-22 ENCOUNTER — Ambulatory Visit: Payer: Medicare Other | Attending: Physical Medicine and Rehabilitation | Admitting: Physical Therapy

## 2016-12-22 DIAGNOSIS — M6281 Muscle weakness (generalized): Secondary | ICD-10-CM

## 2016-12-22 DIAGNOSIS — M25612 Stiffness of left shoulder, not elsewhere classified: Secondary | ICD-10-CM | POA: Diagnosis present

## 2016-12-22 DIAGNOSIS — R293 Abnormal posture: Secondary | ICD-10-CM | POA: Diagnosis present

## 2016-12-22 DIAGNOSIS — R2681 Unsteadiness on feet: Secondary | ICD-10-CM

## 2016-12-22 DIAGNOSIS — G8929 Other chronic pain: Secondary | ICD-10-CM | POA: Diagnosis present

## 2016-12-22 DIAGNOSIS — M25512 Pain in left shoulder: Secondary | ICD-10-CM | POA: Insufficient documentation

## 2016-12-22 NOTE — Patient Instructions (Addendum)
    SHOULDER: External Rotation - Supine (Cane)   Hold cane with both hands. Rotate arm away from body. Keep elbow on floor and next to body. _10__ reps per set, __3_ sets per day, _7_ days per week Add towel to keep elbow at side.  Copyright  VHI. All rights reserved.  Cane Horizontal - Supine   With straight arms holding cane above shoulders, bring cane out to right, center, out to left, and back to above head. Repeat _10__ times. Do _3__ times per day.  Copyright  VHI. All rights reserved.  Cane Exercise: Flexion   Lie on back, holding cane above chest. Keeping arms as straight as possible, lower cane toward floor beyond head. Hold 3___ seconds. Repeat __10__ times. Do __3_ sessions per day.  http://[email or web address].exer.us/91   Copyright  VHI. All rights reserved.   Motion is Lotion for your joints.  Voncille Lo, PT Exercise Expert for the Aging Adult  12/22/16 10:13 AM Phone: [phone] Fax: [phone]

## 2016-12-22 NOTE — Therapy (Signed)
[place], [place], [address] Phone: [phone]   Fax:  [phone]  Physical Therapy Evaluation  Patient Details  Name: Helen Hess MRN: [id number] Date of Birth: 1956-10-07 Referring Provider: Corinna Capra MD  Encounter Date: 12/22/2016      PT End of Session - 12/22/16 1030    Visit Number 1   Number of Visits 16   Date for PT Re-Evaluation 02/16/17   Authorization Type [employer] medicare   PT Start Time 0930   PT Stop Time 1031   PT Time Calculation (min) 61 min   Activity Tolerance Patient limited by pain   Behavior During Therapy [facility] for tasks assessed/performed      Past Medical History:  Diagnosis Date  . Arthritis   . Depression   . H/O blood clots   . Hyperlipemia   . Hypertension     Past Surgical History:  Procedure Laterality Date  . CHOLECYSTECTOMY    . PARTIAL HYSTERECTOMY    . TONSILLECTOMY     child  . TUBAL LIGATION      There were no vitals filed for this visit.       Subjective Assessment - 12/22/16 0942    Subjective I have had pain in left shoulder since 2014.  I was lifting a pain out of oven at work of [employer] and all of a sudden I got a "pop" in my shoulder and my arm went limp. MRI indicates mild diffuse RTC tendonosis.  I went to [place] in 2015 but now my pain still the same in my shoulder   Pertinent History PT with pain in multiple locations back, shoulder left greater than right, neck Pt being seen by [facility]. uses CPAP machine,  depression   Limitations Lifting;House hold activities   How long can you sit comfortably? 20 minutes   How long can you stand comfortably? 10 minutues pain lower back and shoulders   How long can you walk comfortably? 10 minutes in pain lower back and shoulders   Diagnostic tests MRI   Currently in Pain? Yes   Pain Score 10-Worst pain ever  with medication it is 5/10   Pain Location  Shoulder   Pain Orientation Left   Pain Descriptors / Indicators Aching;Tightness;Sore   Pain Type Chronic pain   Pain Onset More than a month ago   Pain Frequency Constant   Aggravating Factors  washing dishes,  ADL's getting dressed, household chores. driving and sleeping   Pain Relieving Factors medicne            [facility] PT Assessment - 12/22/16 0947      Assessment   Medical Diagnosis left shoulder pain and rotator cuff, small incomplete tear of subscapularis   Referring Provider Corinna Capra MD   Onset Date/Surgical Date --  Pt reports injury and pain since 2014   Hand Dominance Left   Prior Therapy YES 2015 [facility]     Precautions   Precautions Fall   Type of Shoulder Precautions pain free motion     Restrictions   Weight Bearing Restrictions No     Balance Screen   Has the patient fallen in the past 6 months Yes   How many times? 1  store on [place]. Pt tripped on water bottle in store   Has the patient had a decrease in activity level because of a fear of falling?  Yes   Is the patient  reluctant to leave their home because of a fear of falling?  Yes     [place] Private residence   Living Arrangements Alone   Home Layout Multi-level  uses elevator     Prior Function   Level of Independence Independent     Cognition   Overall Cognitive Status Within Functional Limits for tasks assessed     Observation/Other Assessments   Focus on Therapeutic Outcomes (FOTO)  FOTO intake 23%  limitation 77% predicted 49%     Posture/Postural Control   Posture/Postural Control Postural limitations   Postural Limitations Rounded Shoulders;Forward head;Anterior pelvic tilt     AROM   Overall AROM  Deficits   Right Shoulder Extension 35 Degrees   Right Shoulder Flexion 131 Degrees   Right Shoulder ABduction 110 Degrees   Right Shoulder Internal Rotation 58 Degrees  abd 90   Right Shoulder External Rotation 90 Degrees  abd 90    Left Shoulder Extension 15 Degrees   Left Shoulder Flexion 80 Degrees   Left Shoulder ABduction 65 Degrees   Left Shoulder Internal Rotation 30 Degrees  abd 90   Left Shoulder External Rotation 20 Degrees  abd 90     PROM   Overall PROM  Deficits   Right Shoulder Flexion 138 Degrees   Right Shoulder ABduction 115 Degrees   Left Shoulder Flexion 115 Degrees   Left Shoulder ABduction 100 Degrees     Strength   Overall Strength Deficits   Overall Strength Comments Pt could not tolerate MMT.  but left UE unable to perform at full range.  so at most 3-/5 in L UE     Palpation   Palpation comment left shoulder with stiff capsule on right, not tolerating deep palpation .  tenderness over left bicipital groove                   [facility] Adult PT Treatment/Exercise - 12/22/16 0947      Shoulder Exercises: Supine   Other Supine Exercises supine cane flex, abd and ER x 10  with VC and TC throughout.      Modalities   Modalities Moist Heat;Electrical Stimulation     Moist Heat Therapy   Number Minutes Moist Heat 15 Minutes   Moist Heat Location Shoulder  left     Electrical Stimulation   Electrical Stimulation Location left shoulder   Electrical Stimulation Action IFC   Electrical Stimulation Parameters to pt tolerance for 15 minutes   Electrical Stimulation Goals Pain                PT Education - 12/22/16 1030    Education provided Yes   Education Details POC, explanation of findigs   Person(s) Educated Patient   Methods Explanation;Demonstration;Verbal cues;Handout;Tactile cues   Comprehension Verbalized understanding;Need further instruction;Returned demonstration          PT Short Term Goals - 12/22/16 1030      PT SHORT TERM GOAL #1   Title "Independent with initial HEP   Time 4   Period Weeks   Status New     PT SHORT TERM GOAL #2   Title "Report pain decrease from   10 /10 to   6 /10.   Time 4   Period Weeks   Status New     PT SHORT  TERM GOAL #3   Title "Demonstrate understanding of proper sitting posture, body mechanics, work ergonomics, and be more conscious of position and posture  throughout the day.    Time 4   Period Weeks   Status New     PT SHORT TERM GOAL #4   Title Pt will increase AROM to at least 90 degrees abduction and flexion with pain at 5/10 or less   Time 4   Period Weeks   Status New           PT Long Term Goals - 12/22/16 1227      PT LONG TERM GOAL #1   Title "Pt will be independent with advanced HEP.    Time 8   Period Weeks   Status New     PT LONG TERM GOAL #2   Title "Pain will decrease to 4/10  or less with all functional activities   Time 8   Period Weeks   Status New     PT LONG TERM GOAL #3   Title LEft  shoulder AROM scaption will improve to 0-120 degrees for improved overhead reaching.    Time 8   Period Weeks   Status New     PT LONG TERM GOAL #4   Title Left shoulder IR and ER will return to [facility] in order  to return to ADLs such as dressing and grooming with at least 50% reduction in pain from eval   Time 8   Period Weeks   Status New     PT LONG TERM GOAL #5   Title "FOTO will improve from 77% limitation   to 49% limitation    indicating improved functional mobility .    Time 8   Period Weeks     Additional Long Term Goals   Additional Long Term Goals Yes     PT LONG TERM GOAL #6   Title Pt would like to return to washing dishes/household chores with at least 4/10 pain level or better to perform with increased comfort and ease from eval   Time 8   Period Weeks               Plan - 12/22/16 1000    Clinical Impression Statement Ms. 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 Pt may benefit from order approved by MD for TENS unit for pain control.  She has multiple medical issues (chornic back pain, knee pain GERD Gout, DVT history , carpal tunnel  left, depression HTN, OA Hypercholesteroiemia, lumbar spondylosis, neck pain ,  and utilized CPAP at night.  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  Pt presents with impairments including pain, severely  limited ROM, weakness, abnormal posture  and as a result, pt is limited with ADLs, turning neck while driving, sleeping  and functional activities. Pt would benefit from skilled outpatient PT services for 2 times a week for 8 weeks to progress toward reduced pain and function in daily life    Rehab Potential Good   PT Frequency 2x / week   PT Duration 8 weeks   PT Treatment/Interventions ADLs/Self Care Home Management;Cryotherapy;Electrical Stimulation;Iontophoresis 4mg /ml Dexamethasone;Ultrasound;Moist Heat;Therapeutic exercise;Neuromuscular re-education;Patient/family education;Passive range of motion;Manual techniques;Dry needling;Taping;Other (comment)  TENS unit   PT Next Visit Plan progress exercise, manual for shoulder,  review supine cane exercises,  Maybe isometric if pt is still at 8/10 to 10/10 and estim.   PT Home Exercise Plan supine cane  Consulted and Agree with Plan of Care Patient      Patient will benefit from skilled therapeutic intervention in order to improve the following deficits and impairments:  Cardiopulmonary status limiting activity, Pain, Obesity, Impaired UE functional use, Postural dysfunction, Improper body mechanics, Hypomobility, Decreased range of motion, Decreased strength  Visit Diagnosis: Chronic left shoulder pain  Abnormal posture  Stiffness of left shoulder, not elsewhere classified  Unsteadiness on feet  Muscle weakness (generalized)      G-Codes - 01-02-2017 1030    Functional Assessment Tool Used FOTO   Functional Limitation Carrying, moving and handling objects   Carrying, Moving and Handling Objects Current Status [phone]) At least 60 percent  but less than 80 percent impaired, limited or restricted  77% limited   Carrying, Moving and Handling Objects Goal Status [id number]) At least 40 percent but less than 60 percent impaired, limited or restricted  49%       Problem List Patient Active Problem List   Diagnosis Date Noted  . HYPERTENSION 04/25/2009  . LOW BACK PAIN, ACUTE 04/25/2009  . DVT, HX OF 04/25/2009   Voncille Lo, PT Exercise [facility] Adult  Jan 02, 2017 12:47 PM Phone: [phone] Fax: [phone]  By signing I understand that I am ordering/authorizing the use of Iontophoresis using 4 mg/mL of dexamethasone as a component of this plan of care. [place], [place], [address] Phone: [phone]   Fax:  [phone]  Name: Helen Hess MRN: [id number] Date of Birth: July 15, 1956

## 2016-12-24 ENCOUNTER — Ambulatory Visit: Payer: Medicare Other | Admitting: Physical Therapy

## 2016-12-24 DIAGNOSIS — M25612 Stiffness of left shoulder, not elsewhere classified: Secondary | ICD-10-CM

## 2016-12-24 DIAGNOSIS — M25512 Pain in left shoulder: Principal | ICD-10-CM

## 2016-12-24 DIAGNOSIS — G8929 Other chronic pain: Secondary | ICD-10-CM

## 2016-12-24 DIAGNOSIS — R2681 Unsteadiness on feet: Secondary | ICD-10-CM

## 2016-12-24 DIAGNOSIS — M6281 Muscle weakness (generalized): Secondary | ICD-10-CM

## 2016-12-24 DIAGNOSIS — R293 Abnormal posture: Secondary | ICD-10-CM

## 2016-12-24 NOTE — Therapy (Signed)
[place], [place], [address] Phone: [phone]   Fax:  [phone]  Physical Therapy Treatment  Patient Details  Name: Helen Hess MRN: [id number] Date of Birth: 12-25-1955 Referring Provider: Corinna Capra MD  Encounter Date: 12/24/2016      PT End of Session - 12/24/16 1051    Visit Number 2   Number of Visits 16   Date for PT Re-Evaluation 02/16/17   PT Start Time 0849   PT Stop Time 0948   PT Time Calculation (min) 59 min   Activity Tolerance Patient tolerated treatment well   Behavior During Therapy [facility] for tasks assessed/performed;Anxious      Past Medical History:  Diagnosis Date  . Arthritis   . Depression   . H/O blood clots   . Hyperlipemia   . Hypertension     Past Surgical History:  Procedure Laterality Date  . CHOLECYSTECTOMY    . PARTIAL HYSTERECTOMY    . TONSILLECTOMY     child  . TUBAL LIGATION      There were no vitals filed for this visit.      Subjective Assessment - 12/24/16 0853    Subjective I felt good after the last session but I woke up stiff.  I was able to do my exercises.   Currently in Pain? Yes   Pain Score 7    Pain Location Shoulder            [facility] PT Assessment - 12/24/16 0001      ROM / Strength   AROM / PROM / Strength --  35 extension 135 flexion AROM elbow left                     [facility] Adult PT Treatment/Exercise - 12/24/16 0001      Self-Care   Self-Care Posture   Posture Model used for education,  chart used for education     Shoulder Exercises: Supine   Horizontal ABduction 5 reps   External Rotation 5 reps  cane   Flexion 5 reps   Flexion Limitations 5 reps, cane small motions     Shoulder Exercises: Isometric Strengthening   Extension 5X5"   External Rotation 5X5"   Internal Rotation 5X5"   ABduction 5X5"   ADduction 5X5"     Modalities   Modalities Moist Heat;Electrical Stimulation     Moist  Heat Therapy   Number Minutes Moist Heat 15 Minutes   Moist Heat Location Shoulder  With IFC     Electrical Stimulation   Electrical Stimulation Location left shoulder   Electrical Stimulation Action IFC   Electrical Stimulation Parameters 7   Electrical Stimulation Goals Pain  patient could not feel all electrodes. Electrodes changed.     Manual Therapy   Manual Therapy Soft tissue mobilization;Passive ROM   Manual therapy comments focused on gentle pain free if possible  soft tissue work shoulder girdle in sidelying.    Passive ROM Left, limited by pain does not tolerate pressure needed for joint mobilization.                  PT Education - 12/24/16 1049    Education provided Yes   Education Details  Posture and how it relates to shoulder   Person(s) Educated Patient   Methods Explanation;Demonstration;Verbal cues   Comprehension Verbalized understanding          PT Short Term Goals - 12/22/16 1030  PT SHORT TERM GOAL #1   Title "Independent with initial HEP   Time 4   Period Weeks   Status New     PT SHORT TERM GOAL #2   Title "Report pain decrease from   10 /10 to   6 /10.   Time 4   Period Weeks   Status New     PT SHORT TERM GOAL #3   Title "Demonstrate understanding of proper sitting posture, body mechanics, work ergonomics, and be more conscious of position and posture throughout the day.    Time 4   Period Weeks   Status New     PT SHORT TERM GOAL #4   Title Pt will increase AROM to at least 90 degrees abduction and flexion with pain at 5/10 or less   Time 4   Period Weeks   Status New           PT Long Term Goals - 12/22/16 1227      PT LONG TERM GOAL #1   Title "Pt will be independent with advanced HEP.    Time 8   Period Weeks   Status New     PT LONG TERM GOAL #2   Title "Pain will decrease to 4/10  or less with all functional activities   Time 8   Period Weeks   Status New     PT LONG TERM GOAL #3   Title LEft   shoulder AROM scaption will improve to 0-120 degrees for improved overhead reaching.    Time 8   Period Weeks   Status New     PT LONG TERM GOAL #4   Title Left shoulder IR and ER will return to [facility] in order  to return to ADLs such as dressing and grooming with at least 50% reduction in pain from eval   Time 8   Period Weeks   Status New     PT LONG TERM GOAL #5   Title "FOTO will improve from 77% limitation   to 49% limitation    indicating improved functional mobility .    Time 8   Period Weeks     Additional Long Term Goals   Additional Long Term Goals Yes     PT LONG TERM GOAL #6   Title Pt would like to return to washing dishes/household chores with at least 4/10 pain level or better to perform with increased comfort and ease from eval   Time 8   Period Weeks               Plan - 12/24/16 1051    Clinical Impression Statement Patient compliant with her HEP.  Isometrics initiated today. No objective changes measured today however patient notes her shoulder was more mobile after isometrics.  Pain flared after manual.  Modalities helpful.   PT Next Visit Plan progress exercise, manual for shoulder,  continue supine cane exercises, Issue Isometrics for home isometric if pt is still at 8/10 to 10/10 and estim.  Try UE ranger   PT Home Exercise Plan supine cane    Consulted and Agree with Plan of Care Patient      Patient will benefit from skilled therapeutic intervention in order to improve the following deficits and impairments:     Visit Diagnosis: Chronic left shoulder pain  Abnormal posture  Stiffness of left shoulder, not elsewhere classified  Unsteadiness on feet  Muscle weakness (generalized)     Problem List Patient Active Problem List  Diagnosis Date Noted  . HYPERTENSION 04/25/2009  . LOW BACK PAIN, ACUTE 04/25/2009  . DVT, HX OF 04/25/2009    Lou Irigoyen PTA 12/24/2016, 10:56 AM  [facility] [address] [place], [place], [address] Phone: [phone]   Fax:  [phone]  Name: Helen Hess MRN: [id number] Date of Birth: [birth year]

## 2016-12-29 ENCOUNTER — Ambulatory Visit: Payer: Medicare Other | Admitting: Physical Therapy

## 2016-12-29 DIAGNOSIS — G8929 Other chronic pain: Secondary | ICD-10-CM

## 2016-12-29 DIAGNOSIS — M25512 Pain in left shoulder: Secondary | ICD-10-CM | POA: Diagnosis not present

## 2016-12-29 DIAGNOSIS — R293 Abnormal posture: Secondary | ICD-10-CM

## 2016-12-29 DIAGNOSIS — M6281 Muscle weakness (generalized): Secondary | ICD-10-CM

## 2016-12-29 DIAGNOSIS — M25612 Stiffness of left shoulder, not elsewhere classified: Secondary | ICD-10-CM

## 2016-12-29 NOTE — Patient Instructions (Signed)
Strengthening: Isometric Flexion  Using wall for resistance, press right fist into ball using light pressure. Hold _5___ seconds. Repeat __5-10__ times per set. Do __1__ sets per session. Do _2___ sessions per day.   Extension (Isometric)  Place left bent elbow and back of arm against wall. Press elbow against wall. Hold _5___ seconds. Repeat __5-10__ times. Do _2___ sessions per day.  Internal Rotation (Isometric)  Place palm of right fist against door frame, with elbow bent. Press fist against door frame. Hold __5__ seconds. Repeat __10__ times. Do _2___ sessions per day.  External Rotation (Isometric)  Place back of left fist against door frame, with elbow bent. Press fist against door frame. Hold _5___ seconds. Repeat __5-10__ times. Do _2___ sessions per day.

## 2016-12-29 NOTE — Therapy (Addendum)
[place], [place], [address] Phone: [phone]   Fax:  [phone]  Physical Therapy Treatment  Patient Details  Name: Helen Hess MRN: [id number] Date of Birth: 1955-12-27 Referring Provider: Corinna Capra MD  Encounter Date: 12/29/2016      PT End of Session - 12/29/16 1156    Visit Number 3   Number of Visits 16   Date for PT Re-Evaluation 02/16/17   Authorization Type [employer] medicare   PT Start Time 1100   PT Stop Time 1200   PT Time Calculation (min) 60 min      Past Medical History:  Diagnosis Date  . Arthritis   . Depression   . H/O blood clots   . Hyperlipemia   . Hypertension     Past Surgical History:  Procedure Laterality Date  . CHOLECYSTECTOMY    . PARTIAL HYSTERECTOMY    . TONSILLECTOMY     child  . TUBAL LIGATION      There were no vitals filed for this visit.      Subjective Assessment - 12/29/16 1106    Subjective Just a little pulling in my shoulder. It's better.    Currently in Pain? Yes   Pain Score 5    Pain Location Shoulder   Pain Orientation Left   Pain Descriptors / Indicators --  nagging, pulling   Aggravating Factors  using the arm, anything   Pain Relieving Factors heat, IFC, massage             [facility] PT Assessment - 12/29/16 0001      AROM   Left Shoulder Flexion 120 Degrees   Left Shoulder ABduction 75 Degrees   Left Shoulder Internal Rotation 35 Degrees  45 degrees abduction   Left Shoulder External Rotation 45 Degrees  45 degrees abduction                     [facility] Adult PT Treatment/Exercise - 12/29/16 0001      Shoulder Exercises: Supine   Other Supine Exercises Supine cane chest press, pullovers, ER, horizontal abduction x 10 each cues for comfortable ROM     Shoulder Exercises: Isometric Strengthening   Flexion 5X5"   Extension 5X5"   External Rotation 5X5"   Internal Rotation 5X5"   ABduction 5X5"     Moist  Heat Therapy   Number Minutes Moist Heat 15 Minutes   Moist Heat Location Shoulder     Electrical Stimulation   Electrical Stimulation Location left shoulder   Electrical Stimulation Action IFC   Electrical Stimulation Parameters 7   Electrical Stimulation Goals Pain     Manual Therapy   Manual Therapy Soft tissue mobilization;Passive ROM   Soft tissue mobilization Upper trap and levator , teres    Passive ROM Flexion, ER, IR all gentle to tolerance                 PT Education - 12/29/16 1144    Education provided Yes   Education Details HEP   Person(s) Educated Patient   Methods Explanation;Handout   Comprehension Verbalized understanding          PT Short Term Goals - 12/22/16 1030      PT SHORT TERM GOAL #1   Title "Independent with initial HEP   Time 4   Period Weeks   Status New     PT SHORT TERM GOAL #2   Title "Report pain decrease from  10 /10 to   6 /10.   Time 4   Period Weeks   Status New     PT SHORT TERM GOAL #3   Title "Demonstrate understanding of proper sitting posture, body mechanics, work ergonomics, and be more conscious of position and posture throughout the day.    Time 4   Period Weeks   Status New     PT SHORT TERM GOAL #4   Title Pt will increase AROM to at least 90 degrees abduction and flexion with pain at 5/10 or less   Time 4   Period Weeks   Status New           PT Long Term Goals - 12/22/16 1227      PT LONG TERM GOAL #1   Title "Pt will be independent with advanced HEP.    Time 8   Period Weeks   Status New     PT LONG TERM GOAL #2   Title "Pain will decrease to 4/10  or less with all functional activities   Time 8   Period Weeks   Status New     PT LONG TERM GOAL #3   Title LEft  shoulder AROM scaption will improve to 0-120 degrees for improved overhead reaching.    Time 8   Period Weeks   Status New     PT LONG TERM GOAL #4   Title Left shoulder IR and ER will return to [facility] in order  to return  to ADLs such as dressing and grooming with at least 50% reduction in pain from eval   Time 8   Period Weeks   Status New     PT LONG TERM GOAL #5   Title "FOTO will improve from 77% limitation   to 49% limitation    indicating improved functional mobility .    Time 8   Period Weeks     Additional Long Term Goals   Additional Long Term Goals Yes     PT LONG TERM GOAL #6   Title Pt would like to return to washing dishes/household chores with at least 4/10 pain level or better to perform with increased comfort and ease from eval   Time 8   Period Weeks               Plan - 12/29/16 1145    Clinical Impression Statement Pt presents with reports of decreased pain and demonstrates increased ROM. Repeated isometrics and supine cane  (pt does not recall isometrcis from last visit). She is able to tolerate increased repetitions with supine cane exercises. She reports fatigue. Continued manual to left upper trap, levator and teres. IFC and HMP per pt request.    PT Next Visit Plan progress exercise, manual for shoulder,  continue supine cane exercises, review isometric HEP.  Try UE ranger   PT Home Exercise Plan supine cane , isometrics    Consulted and Agree with Plan of Care Patient      Patient will benefit from skilled therapeutic intervention in order to improve the following deficits and impairments:  Cardiopulmonary status limiting activity, Pain, Obesity, Impaired UE functional use, Postural dysfunction, Improper body mechanics, Hypomobility, Decreased range of motion, Decreased strength  Visit Diagnosis: Chronic left shoulder pain  Abnormal posture  Stiffness of left shoulder, not elsewhere classified  Muscle weakness (generalized)     Problem List Patient Active Problem List   Diagnosis Date Noted  . HYPERTENSION 04/25/2009  . LOW BACK  PAIN, ACUTE 04/25/2009  . DVT, HX OF 04/25/2009    Dorene Ar, PTA 12/29/2016, 3:25 PM  [facility] [address] [place], [place], [address] Phone: [phone]   Fax:  [phone]  Name: Helen Hess MRN: [id number] Date of Birth: Jul 07, 1956

## 2016-12-31 ENCOUNTER — Ambulatory Visit: Payer: Medicare Other | Admitting: Physical Therapy

## 2017-01-05 ENCOUNTER — Ambulatory Visit: Payer: Medicare Other | Admitting: Physical Therapy

## 2017-01-05 DIAGNOSIS — M25512 Pain in left shoulder: Secondary | ICD-10-CM | POA: Diagnosis not present

## 2017-01-05 DIAGNOSIS — G8929 Other chronic pain: Secondary | ICD-10-CM

## 2017-01-05 NOTE — Therapy (Signed)
[place], [place], [address] Phone: [phone]   Fax:  [phone]  Physical Therapy Treatment  Patient Details  Name: Helen Hess MRN: [id number] Date of Birth: 1956/01/15 Referring Provider: Corinna Capra MD  Encounter Date: 01/05/2017      PT End of Session - 01/05/17 0937    Visit Number 4   Number of Visits 16   Date for PT Re-Evaluation 02/16/17   Authorization Type [employer] medicare   PT Start Time 1100   PT Stop Time 1145   PT Time Calculation (min) 45 min   Activity Tolerance Patient limited by pain      Past Medical History:  Diagnosis Date  . Arthritis   . Depression   . H/O blood clots   . Hyperlipemia   . Hypertension     Past Surgical History:  Procedure Laterality Date  . CHOLECYSTECTOMY    . PARTIAL HYSTERECTOMY    . TONSILLECTOMY     child  . TUBAL LIGATION      There were no vitals filed for this visit.      Subjective Assessment - 01/05/17 0934    Subjective I am stiff. The exercises help but I wake up stiff again.    Currently in Pain? Yes   Pain Score 8    Pain Location Shoulder  and neck   Pain Orientation Left;Right   Pain Descriptors / Indicators --  nagging and pulling   Pain Type Chronic pain   Pain Onset More than a month ago   Pain Frequency Constant   Aggravating Factors  anything, first wake up, neck side bend left    Pain Relieving Factors heat, IFC             [facility] PT Assessment - 01/05/17 0001      ROM / Strength   AROM / PROM / Strength PROM     PROM   PROM Assessment Site Shoulder   Right/Left Shoulder Left   Left Shoulder Flexion 90 Degrees   Left Shoulder ABduction 85 Degrees   Left Shoulder Internal Rotation 45 Degrees   Left Shoulder External Rotation 70 Degrees                     [facility] Adult PT Treatment/Exercise - 01/05/17 0001      Self-Care   Self-Care Posture   Posture Demonstrated poor posture and  proper posture as well as discussed the strain poor posture places on the spine and shoulders     Shoulder Exercises: Supine   Other Supine Exercises able to tolerate 3-5 reps of chest press, ER only today due to increased pain      Shoulder Exercises: Seated   Retraction 10 reps   Other Seated Exercises Sitting with correct posture and tolerated during treatment     Moist Heat Therapy   Number Minutes Moist Heat 15 Minutes   Moist Heat Location Shoulder  left in sidelying     Electrical Stimulation   Electrical Stimulation Location left shoulder    Electrical Stimulation Action IFC    Electrical Stimulation Parameters 7   Electrical Stimulation Goals Pain     Manual Therapy   Soft tissue mobilization attempted upper traps, declined    Passive ROM Flexion, ER, IR all gentle to tolerance                 PT Education - 01/05/17 1041    Education provided  Yes   Education Details Posture and Pain   Person(s) Educated Patient   Methods Explanation;Demonstration   Comprehension Verbalized understanding          PT Short Term Goals - 12/22/16 1030      PT SHORT TERM GOAL #1   Title "Independent with initial HEP   Time 4   Period Weeks   Status New     PT SHORT TERM GOAL #2   Title "Report pain decrease from   10 /10 to   6 /10.   Time 4   Period Weeks   Status New     PT SHORT TERM GOAL #3   Title "Demonstrate understanding of proper sitting posture, body mechanics, work ergonomics, and be more conscious of position and posture throughout the day.    Time 4   Period Weeks   Status New     PT SHORT TERM GOAL #4   Title Pt will increase AROM to at least 90 degrees abduction and flexion with pain at 5/10 or less   Time 4   Period Weeks   Status New           PT Long Term Goals - 12/22/16 1227      PT LONG TERM GOAL #1   Title "Pt will be independent with advanced HEP.    Time 8   Period Weeks   Status New     PT LONG TERM GOAL #2   Title  "Pain will decrease to 4/10  or less with all functional activities   Time 8   Period Weeks   Status New     PT LONG TERM GOAL #3   Title LEft  shoulder AROM scaption will improve to 0-120 degrees for improved overhead reaching.    Time 8   Period Weeks   Status New     PT LONG TERM GOAL #4   Title Left shoulder IR and ER will return to [facility] in order  to return to ADLs such as dressing and grooming with at least 50% reduction in pain from eval   Time 8   Period Weeks   Status New     PT LONG TERM GOAL #5   Title "FOTO will improve from 77% limitation   to 49% limitation    indicating improved functional mobility .    Time 8   Period Weeks     Additional Long Term Goals   Additional Long Term Goals Yes     PT LONG TERM GOAL #6   Title Pt would like to return to washing dishes/household chores with at least 4/10 pain level or better to perform with increased comfort and ease from eval   Time 8   Period Weeks               Plan - 01/05/17 1027    Clinical Impression Statement Pt arrives to PT requesting electrical stimulation treatment and rates her pain as 8/10. She is seated with slouched posture and elevated shoulders. Education provided on posture as it relates to continued pain in back, neck and shoulders. Introduced pt to theracane for self trigger point release. Attempted soft tissue work to upper traps however pt requests to decline soft tissue work today due to increased pain. Passive ROM perfromed to left shoulder with pt unable to tolerate higher than 90 degrees of flexion. After encouragement to relax left shoulder IR ROM is 45 and ER is 70. Review of supine cane exercises  with pt only tolerating a few reps each and c/o increased left shoulder, neck and upper back  pain.  Pt would like TENS unti and asks if it was requested. Plan of care has been signed however we need an independent order for the TENS so I faxed one today to her MD.    PT Next Visit Plan progress  exercise, manual for shoulder,  continue supine cane exercises, review isometric HEP if pain levels lower.   Try UE ranger; check for TENS order signed back and issue    PT Home Exercise Plan supine cane , isometrics    Consulted and Agree with Plan of Care Patient      Patient will benefit from skilled therapeutic intervention in order to improve the following deficits and impairments:  Cardiopulmonary status limiting activity, Pain, Obesity, Impaired UE functional use, Postural dysfunction, Improper body mechanics, Hypomobility, Decreased range of motion, Decreased strength  Visit Diagnosis: Chronic left shoulder pain     Problem List Patient Active Problem List   Diagnosis Date Noted  . HYPERTENSION 04/25/2009  . LOW BACK PAIN, ACUTE 04/25/2009  . DVT, HX OF 04/25/2009    Dorene Ar, PTA 01/05/2017, 10:49 AM  [facility] [address] [place], [place], [address] Phone: [phone]   Fax:  [phone]  Name: Helen Hess MRN: [id number] Date of Birth: 01-29-1956

## 2017-01-07 ENCOUNTER — Ambulatory Visit: Payer: Medicare Other | Admitting: Physical Therapy

## 2017-01-07 DIAGNOSIS — M25612 Stiffness of left shoulder, not elsewhere classified: Secondary | ICD-10-CM

## 2017-01-07 DIAGNOSIS — R293 Abnormal posture: Secondary | ICD-10-CM

## 2017-01-07 DIAGNOSIS — M25512 Pain in left shoulder: Principal | ICD-10-CM

## 2017-01-07 DIAGNOSIS — M6281 Muscle weakness (generalized): Secondary | ICD-10-CM

## 2017-01-07 DIAGNOSIS — G8929 Other chronic pain: Secondary | ICD-10-CM

## 2017-01-07 DIAGNOSIS — R2681 Unsteadiness on feet: Secondary | ICD-10-CM

## 2017-01-07 NOTE — Patient Instructions (Signed)
   Trigger Point Dry Needling  . What is Trigger Point Dry Needling (DN)? o DN is a physical therapy technique used to treat muscle pain and dysfunction. Specifically, DN helps deactivate muscle trigger points (muscle knots).  o A thin filiform needle is used to penetrate the skin and stimulate the underlying trigger point. The goal is for a local twitch response (LTR) to occur and for the trigger point to relax. No medication of any kind is injected during the procedure.   . What Does Trigger Point Dry Needling Feel Like?  o The procedure feels different for each individual patient. Some patients report that they do not actually feel the needle enter the skin and overall the process is not painful. Very mild bleeding may occur. However, many patients feel a deep cramping in the muscle in which the needle was inserted. This is the local twitch response.   Marland Kitchen How Will I feel after the treatment? o Soreness is normal, and the onset of soreness may not occur for a few hours. Typically this soreness does not last longer than two days.  o Bruising is uncommon, however; ice can be used to decrease any possible bruising.  o In rare cases feeling tired or nauseous after the treatment is normal. In addition, your symptoms may get worse before they get better, this period will typically not last longer than 24 hours.   . What Can I do After My Treatment? o Increase your hydration by drinking more water for the next 24 hours. o You may place ice or heat on the areas treated that have become sore, however, do not use heat on inflamed or bruised areas. Heat often brings more relief post needling. o You can continue your regular activities, but vigorous activity is not recommended initially after the treatment for 24 hours. o DN is best combined with other physical therapy such as strengthening, stretching, and other therapies.   Voncille Lo, PT Exercise Expert for the Aging Adult  01/07/17 11:14  AM Phone: [phone] Fax: [phone]

## 2017-01-07 NOTE — Therapy (Signed)
[place], [place], [address] Phone: [phone]   Fax:  [phone]  Physical Therapy Treatment  Patient Details  Name: Helen Hess MRN: [id number] Date of Birth: 1956/08/04 Referring Provider: Corinna Capra MD  Encounter Date: 01/07/2017      PT End of Session - 01/07/17 1404    Visit Number 5   Number of Visits 16   Date for PT Re-Evaluation 02/16/17   Authorization Type [employer] medicare   PT Start Time 1100   PT Stop Time 1145   PT Time Calculation (min) 45 min   Activity Tolerance Patient limited by pain   Behavior During Therapy [facility] for tasks assessed/performed;Anxious      Past Medical History:  Diagnosis Date  . Arthritis   . Depression   . H/O blood clots   . Hyperlipemia   . Hypertension     Past Surgical History:  Procedure Laterality Date  . CHOLECYSTECTOMY    . PARTIAL HYSTERECTOMY    . TONSILLECTOMY     child  . TUBAL LIGATION      There were no vitals filed for this visit.      Subjective Assessment - 01/07/17 1104    Subjective I am so stiff. I cant move my neck or arm,  I wake up this way.    Pertinent History PT with pain in multiple locations back, shoulder left greater than right, neck Pt being seen by [facility]. uses CPAP machine,  depression   Currently in Pain? Yes   Pain Score 7    Pain Location Shoulder   Pain Orientation Right;Left  L> R   Pain Descriptors / Indicators Aching   Pain Type Chronic pain   Pain Onset More than a month ago   Pain Frequency Constant            [facility] PT Assessment - 01/07/17 1141      PROM   Left Shoulder Flexion 100 Degrees  Pt sidelying able to touch top of head  c e stim   Left Shoulder ABduction 96 Degrees   Left Shoulder Internal Rotation 45 Degrees   Left Shoulder External Rotation 70 Degrees                     [facility] Adult PT Treatment/Exercise - 01/07/17 1118      Moist Heat  Therapy   Number Minutes Moist Heat 15 Minutes   Moist Heat Location Shoulder;Lumbar Spine  left in sidelying     Electrical Stimulation   Electrical Stimulation Location left shoulder    Electrical Stimulation Action IFC   Electrical Stimulation Parameters 10   Electrical Stimulation Goals Pain     Manual Therapy   Manual Therapy Soft tissue mobilization;Passive ROM   Soft tissue mobilization Upper trap and levator , teres RTC as pt tolrates  left only   Passive ROM Flexion, ER, IR all gentle to tolerance           Trigger Point Dry Needling - 01/07/17 1401    Consent Given? Yes   Education Handout Provided Yes   Muscles Treated Upper Body Upper trapezius;Levator scapulae  infraspinatus   Upper Trapezius Response Twitch reponse elicited;Palpable increased muscle length   Levator Scapulae Response Twitch response elicited;Palpable increased muscle length              PT Education - 01/07/17 1114    Education provided Yes   Education Details education  on trigger point dry needling and neck /shoulder stretches   Person(s) Educated Patient   Methods Explanation;Demonstration;Tactile cues;Verbal cues;Handout   Comprehension Verbalized understanding;Returned demonstration          PT Short Term Goals - 01/07/17 1106      PT SHORT TERM GOAL #1   Title "Independent with initial HEP   Time 4   Period Weeks   Status On-going     PT SHORT TERM GOAL #2   Title "Report pain decrease from   10 /10 to   6 /10.   Time 4   Period Weeks   Status On-going     PT SHORT TERM GOAL #3   Title "Demonstrate understanding of proper sitting posture, body mechanics, work ergonomics, and be more conscious of position and posture throughout the day.    Time 4   Period Weeks   Status On-going     PT SHORT TERM GOAL #4   Title Pt will increase AROM to at least 90 degrees abduction and flexion with pain at 5/10 or less   Time 4   Period Weeks   Status On-going            PT Long Term Goals - 12/22/16 1227      PT LONG TERM GOAL #1   Title "Pt will be independent with advanced HEP.    Time 8   Period Weeks   Status New     PT LONG TERM GOAL #2   Title "Pain will decrease to 4/10  or less with all functional activities   Time 8   Period Weeks   Status New     PT LONG TERM GOAL #3   Title LEft  shoulder AROM scaption will improve to 0-120 degrees for improved overhead reaching.    Time 8   Period Weeks   Status New     PT LONG TERM GOAL #4   Title Left shoulder IR and ER will return to [facility] in order  to return to ADLs such as dressing and grooming with at least 50% reduction in pain from eval   Time 8   Period Weeks   Status New     PT LONG TERM GOAL #5   Title "FOTO will improve from 77% limitation   to 49% limitation    indicating improved functional mobility .    Time 8   Period Weeks     Additional Long Term Goals   Additional Long Term Goals Yes     PT LONG TERM GOAL #6   Title Pt would like to return to washing dishes/household chores with at least 4/10 pain level or better to perform with increased comfort and ease from eval   Time 8   Period Weeks               Plan - 01/07/17 1144    Clinical Impression Statement Pt arrives with very flexed posture and said she was in 7/10 pain with left shoulder stiffer than right.  She is seated with flexed and gaurded posture without utilizing neck AROM to scan her enviormnent.  Pt consented to trigger point dry needling and was educated in aftercare and procedure and precautions.  Pt was monitored throughout session closely.  Pt had immediate release of spasm in left upper trap.  Pt with increased AROM of shoulder on left and gained  100 flex and abd 95 with ability to touch head in sidelying with hand  Rehab Potential Good   PT Frequency 2x / week   PT Duration 8 weeks   PT Treatment/Interventions ADLs/Self Care Home Management;Cryotherapy;Electrical Stimulation;Iontophoresis 4mg /ml  Dexamethasone;Ultrasound;Moist Heat;Therapeutic exercise;Neuromuscular re-education;Patient/family education;Passive range of motion;Manual techniques;Dry needling;Taping;Other (comment)   PT Next Visit Plan asses benefit of dry needling.manual for shoulder,  continue supine cane exercises, Try UE ranger; check for TENS order signed back and issue    PT Home Exercise Plan supine cane , isometrics    Consulted and Agree with Plan of Care Patient      Patient will benefit from skilled therapeutic intervention in order to improve the following deficits and impairments:  Cardiopulmonary status limiting activity, Pain, Obesity, Impaired UE functional use, Postural dysfunction, Improper body mechanics, Hypomobility, Decreased range of motion, Decreased strength  Visit Diagnosis: Chronic left shoulder pain  Abnormal posture  Stiffness of left shoulder, not elsewhere classified  Muscle weakness (generalized)  Unsteadiness on feet     Problem List Patient Active Problem List   Diagnosis Date Noted  . HYPERTENSION 04/25/2009  . LOW BACK PAIN, ACUTE 04/25/2009  . DVT, HX OF 04/25/2009  Voncille Lo, PT Exercise Expert for the Aging Adult  01/07/17 2:05 PM Phone: [phone] Fax: [facility] [address] [place], [place], [address] Phone: [phone]   Fax:  [phone]  Name: Helen Hess MRN: [id number] Date of Birth: July 15, 1956

## 2017-01-12 ENCOUNTER — Ambulatory Visit: Payer: Medicare Other | Admitting: Physical Therapy

## 2017-01-12 DIAGNOSIS — R293 Abnormal posture: Secondary | ICD-10-CM

## 2017-01-12 DIAGNOSIS — M25512 Pain in left shoulder: Secondary | ICD-10-CM | POA: Diagnosis not present

## 2017-01-12 DIAGNOSIS — M25612 Stiffness of left shoulder, not elsewhere classified: Secondary | ICD-10-CM

## 2017-01-12 DIAGNOSIS — M6281 Muscle weakness (generalized): Secondary | ICD-10-CM

## 2017-01-12 DIAGNOSIS — R2681 Unsteadiness on feet: Secondary | ICD-10-CM

## 2017-01-12 DIAGNOSIS — G8929 Other chronic pain: Secondary | ICD-10-CM

## 2017-01-12 NOTE — Therapy (Signed)
[place], [place], [address] Phone: [phone]   Fax:  [phone]  Physical Therapy Treatment  Patient Details  Name: Helen Hess MRN: [id number] Date of Birth: Sep 13, 1956 Referring Provider: Corinna Capra MD  Encounter Date: 01/12/2017      PT End of Session - 01/12/17 1107    Visit Number 6   Number of Visits 16   Date for PT Re-Evaluation 02/16/17   Authorization Type [employer] medicare   PT Start Time 1101   PT Stop Time 1200   PT Time Calculation (min) 59 min      Past Medical History:  Diagnosis Date  . Arthritis   . Depression   . H/O blood clots   . Hyperlipemia   . Hypertension     Past Surgical History:  Procedure Laterality Date  . CHOLECYSTECTOMY    . PARTIAL HYSTERECTOMY    . TONSILLECTOMY     child  . TUBAL LIGATION      There were no vitals filed for this visit.      Subjective Assessment - 01/12/17 1106    Subjective I feel alot better after the TPDN. It still hurts but not as bad.    Currently in Pain? Yes   Pain Score 4    Pain Location Shoulder   Pain Orientation Left   Pain Descriptors / Indicators Sore   Aggravating Factors  reaching    Pain Relieving Factors heat IFC            [facility] PT Assessment - 01/12/17 0001      AROM   Left Shoulder Flexion 125 Degrees   Left Shoulder ABduction 60 Degrees   Left Shoulder Internal Rotation --  reach to sacrum   Left Shoulder External Rotation --  reach to upper trap                     [facility] Adult PT Treatment/Exercise - 01/12/17 0001      Shoulder Exercises: Seated   Retraction 10 reps     Shoulder Exercises: Standing   Other Standing Exercises standing cane flex, abdct, IR, also standing UE ranger on floor and wall to tolerance in flexion     Shoulder Exercises: Pulleys   Flexion 2 minutes     Electrical Stimulation   Electrical Stimulation Location left shoulder    Electrical  Stimulation Action IFC   Electrical Stimulation Parameters 15   Electrical Stimulation Goals Pain     Manual Therapy   Manual Therapy Soft tissue mobilization;Passive ROM   Soft tissue mobilization Upper trap and levator , teres RTC as pt tolrates  left only     Neck Exercises: Stretches   Upper Trapezius Stretch 3 reps;20 seconds                  PT Short Term Goals - 01/07/17 1106      PT SHORT TERM GOAL #1   Title "Independent with initial HEP   Time 4   Period Weeks   Status On-going     PT SHORT TERM GOAL #2   Title "Report pain decrease from   10 /10 to   6 /10.   Time 4   Period Weeks   Status On-going     PT SHORT TERM GOAL #3   Title "Demonstrate understanding of proper sitting posture, body mechanics, work ergonomics, and be more conscious of position and posture throughout the day.  Time 4   Period Weeks   Status On-going     PT SHORT TERM GOAL #4   Title Pt will increase AROM to at least 90 degrees abduction and flexion with pain at 5/10 or less   Time 4   Period Weeks   Status On-going           PT Long Term Goals - 12/22/16 1227      PT LONG TERM GOAL #1   Title "Pt will be independent with advanced HEP.    Time 8   Period Weeks   Status New     PT LONG TERM GOAL #2   Title "Pain will decrease to 4/10  or less with all functional activities   Time 8   Period Weeks   Status New     PT LONG TERM GOAL #3   Title LEft  shoulder AROM scaption will improve to 0-120 degrees for improved overhead reaching.    Time 8   Period Weeks   Status New     PT LONG TERM GOAL #4   Title Left shoulder IR and ER will return to [facility] in order  to return to ADLs such as dressing and grooming with at least 50% reduction in pain from eval   Time 8   Period Weeks   Status New     PT LONG TERM GOAL #5   Title "FOTO will improve from 77% limitation   to 49% limitation    indicating improved functional mobility .    Time 8   Period Weeks      Additional Long Term Goals   Additional Long Term Goals Yes     PT LONG TERM GOAL #6   Title Pt would like to return to washing dishes/household chores with at least 4/10 pain level or better to perform with increased comfort and ease from eval   Time 8   Period Weeks               Plan - 01/12/17 1303    Clinical Impression Statement Mrs. Graffam reports significant decrease in pain after TPDN last visit. She is able to tolerate AAROM exercises today and her AROM flexion reached 125 today. She did well with standing AAROM using UE ranger and cane. She may try some of those at home. Soft tissue work performed to left traps, and deltoid as well asn IFC. I refaxed the TENS order to MD and called and left message on RN voicemail today. I asked pt to call as well.    PT Next Visit Plan asses benefit of dry needling.manual for shoulder,  continue supine cane exercises, Try UE ranger; check for TENS order signed back and issue ; add standing cane to HEP; if toerated well   PT Home Exercise Plan supine cane , isometrics    Consulted and Agree with Plan of Care Patient      Patient will benefit from skilled therapeutic intervention in order to improve the following deficits and impairments:     Visit Diagnosis: Chronic left shoulder pain  Abnormal posture  Stiffness of left shoulder, not elsewhere classified  Muscle weakness (generalized)  Unsteadiness on feet     Problem List Patient Active Problem List   Diagnosis Date Noted  . HYPERTENSION 04/25/2009  . LOW BACK PAIN, ACUTE 04/25/2009  . DVT, HX OF 04/25/2009    Dorene Ar, PTA 01/12/2017, 1:07 PM  [place]  [place], [place], [address] Phone: [phone]   Fax:  [phone]  Name: NIVEEN ISON MRN: [id number] Date of Birth: 09/29/1956

## 2017-01-14 ENCOUNTER — Ambulatory Visit: Payer: Medicare Other | Attending: Physical Medicine and Rehabilitation | Admitting: Physical Therapy

## 2017-01-14 DIAGNOSIS — M25512 Pain in left shoulder: Secondary | ICD-10-CM | POA: Insufficient documentation

## 2017-01-14 DIAGNOSIS — M6281 Muscle weakness (generalized): Secondary | ICD-10-CM | POA: Diagnosis present

## 2017-01-14 DIAGNOSIS — R293 Abnormal posture: Secondary | ICD-10-CM | POA: Insufficient documentation

## 2017-01-14 DIAGNOSIS — M25612 Stiffness of left shoulder, not elsewhere classified: Secondary | ICD-10-CM | POA: Diagnosis present

## 2017-01-14 DIAGNOSIS — G8929 Other chronic pain: Secondary | ICD-10-CM | POA: Insufficient documentation

## 2017-01-14 DIAGNOSIS — R2681 Unsteadiness on feet: Secondary | ICD-10-CM | POA: Insufficient documentation

## 2017-01-14 NOTE — Patient Instructions (Signed)
TENS stands for Transcutaneous Electrical Nerve Stimulation. In other words, electrical impulses are allowed to pass through the skin in order to excite a nerve.   Purpose and Use of TENS:  TENS is a method used to manage acute and chronic pain without the use of drugs. It has been effective in managing pain associated with surgery, sprains, strains, trauma, rheumatoid arthritis, and neuralgias. It is a non-addictive, low risk, and non-invasive technique used to control pain. It is not, by any means, a curative form of treatment.   How TENS Works:  Most TENS units are a Paramedic unit powered by one 9 volt battery. Attached to the outside of the unit are two lead wires where two pins and/or snaps connect on each wire. All units come with a set of four reusable pads or electrodes. These are placed on the skin surrounding the area involved. By inserting the leads into  the pads, the electricity can pass from the unit making the circuit complete.  As the intensity is turned up slowly, the electrical current enters the body from the electrodes through the skin to the surrounding nerve fibers. This triggers the release of hormones from within the body. These hormones contain pain relievers. By increasing the circulation of these hormones, the person's pain may be lessened. It is also believed that the electrical stimulation itself helps to block the pain messages being sent to the brain, thus also decreasing the body's perception of pain.   Hazards:  TENS units are NOT to be used by patients with PACEMAKERS, DEFIBRILLATORS, DIABETIC PUMPS, PREGNANT WOMEN, and patients with SEIZURE DISORDERS.  TENS units are NOT to be used over the heart, throat, brain, or spinal cord.  One of the major side effects from the TENS unit may be skin irritation. Some people may develop a rash if they are sensitive to the materials used in the electrodes or the connecting wires.   Wear the unit for 1 hour on and then 1  hour off.  Try to extend time between to utilize only when needed.  May also use when performing exercises to increase comfort and completion.Marland Kitchen   Avoid overuse due the body getting used to the stem making it not as effective over time.    Voncille Lo, PT Exercise Expert for the Aging Adult  01/14/17 10:57 AM Phone: [phone] Fax: [phone]

## 2017-01-14 NOTE — Therapy (Signed)
[place], [place], [address] Phone: [phone]   Fax:  [phone]  Physical Therapy Treatment  Patient Details  Name: Helen Hess MRN: [id number] Date of Birth: 02-10-1956 Referring Provider: Corinna Capra MD  Encounter Date: 01/14/2017      PT End of Session - 01/14/17 1327    Visit Number 7   Number of Visits 16   Date for PT Re-Evaluation 02/16/17   Authorization Type UHC medicare   PT Start Time 1100   PT Stop Time 1145   PT Time Calculation (min) 45 min   Activity Tolerance Patient limited by pain   Behavior During Therapy [facility] for tasks assessed/performed;Anxious      Past Medical History:  Diagnosis Date  . Arthritis   . Depression   . H/O blood clots   . Hyperlipemia   . Hypertension     Past Surgical History:  Procedure Laterality Date  . CHOLECYSTECTOMY    . PARTIAL HYSTERECTOMY    . TONSILLECTOMY     child  . TUBAL LIGATION      There were no vitals filed for this visit.      Subjective Assessment - 01/14/17 1123    Subjective I feel a lot better after the TPDN but i stilll have couple of spots that catch   Pertinent History PT with pain in multiple locations back, shoulder left greater than right, neck Pt being seen by [facility]. uses CPAP machine,  depression   Patient Stated Goals Be able to move without pain   Currently in Pain? Yes   Pain Score 4    Pain Orientation Left   Pain Descriptors / Indicators Sore   Pain Type Chronic pain   Pain Onset More than a month ago   Aggravating Factors  any reaching                         [facility] Adult PT Treatment/Exercise - 01/14/17 1129      Self-Care   Self-Care Other Self-Care Comments   Other Self-Care Comments  TENS application and return demo     Shoulder Exercises: Seated   Other Seated Exercises seated cane exercises with TENS unit on bil flex x 10 bil ER x 10 and bil abd x 10      Manual Therapy   Manual Therapy Soft tissue mobilization;Passive ROM   Soft tissue mobilization Upper trap and levator as pt tolerates bil  left only          Trigger Point Dry Needling - 01/14/17 1129    Consent Given? Yes   Muscles Treated Upper Body Upper trapezius;Levator scapulae;Supraspinatus   Upper Trapezius Response Twitch reponse elicited;Palpable increased muscle length   Levator Scapulae Response Twitch response elicited;Palpable increased muscle length   Supraspinatus Response Twitch response elicited  left side only              PT Education - 01/14/17 1057    Education provided Yes   Education Details education on use of TENS unit application and use with return DEMO with exercises seated cane exercises   Person(s) Educated Patient   Methods Explanation   Comprehension Verbalized understanding;Returned demonstration          PT Short Term Goals - 01/07/17 1106      PT SHORT TERM GOAL #1   Title "Independent with initial HEP   Time 4   Period Weeks  Status On-going     PT SHORT TERM GOAL #2   Title "Report pain decrease from   10 /10 to   6 /10.   Time 4   Period Weeks   Status On-going     PT SHORT TERM GOAL #3   Title "Demonstrate understanding of proper sitting posture, body mechanics, work ergonomics, and be more conscious of position and posture throughout the day.    Time 4   Period Weeks   Status On-going     PT SHORT TERM GOAL #4   Title Pt will increase AROM to at least 90 degrees abduction and flexion with pain at 5/10 or less   Time 4   Period Weeks   Status On-going           PT Long Term Goals - 12/22/16 1227      PT LONG TERM GOAL #1   Title "Pt will be independent with advanced HEP.    Time 8   Period Weeks   Status New     PT LONG TERM GOAL #2   Title "Pain will decrease to 4/10  or less with all functional activities   Time 8   Period Weeks   Status New     PT LONG TERM GOAL #3   Title LEft  shoulder  AROM scaption will improve to 0-120 degrees for improved overhead reaching.    Time 8   Period Weeks   Status New     PT LONG TERM GOAL #4   Title Left shoulder IR and ER will return to [facility] in order  to return to ADLs such as dressing and grooming with at least 50% reduction in pain from eval   Time 8   Period Weeks   Status New     PT LONG TERM GOAL #5   Title "FOTO will improve from 77% limitation   to 49% limitation    indicating improved functional mobility .    Time 8   Period Weeks     Additional Long Term Goals   Additional Long Term Goals Yes     PT LONG TERM GOAL #6   Title Pt would like to return to washing dishes/household chores with at least 4/10 pain level or better to perform with increased comfort and ease from eval   Time 8   Period Weeks               Plan - 01/14/17 1328    Clinical Impression Statement [name] reports improvement in pain after TPDN but still with significant muscle knots and inability to lift arms above 90 degrees without pain.  Pt recieved TENS order and TENS was given today and pt return demo in order to utilize for pain relief at home.  Pt utilized TENS unit with seated cane exercises and was able to perform with improved motor control.  Pt will continue stretngthening. manual and TPDN as needed to accomplish goals   Rehab Potential Good   PT Frequency 2x / week   PT Duration 8 weeks   PT Treatment/Interventions ADLs/Self Care Home Management;Cryotherapy;Electrical Stimulation;Iontophoresis 4mg /ml Dexamethasone;Ultrasound;Moist Heat;Therapeutic exercise;Neuromuscular re-education;Patient/family education;Passive range of motion;Manual techniques;Dry needling;Taping;Other (comment)   PT Next Visit Plan  continue supine cane exercises, Try UE ranger; add standing cane to HEP; if toerated well next visit Assess goals next visit/ progress note and FOTO next visit as able   PT Home Exercise Plan supine cane , isometrics    Consulted and  Agree with Plan of Care Patient      Patient will benefit from skilled therapeutic intervention in order to improve the following deficits and impairments:  Cardiopulmonary status limiting activity, Pain, Obesity, Impaired UE functional use, Postural dysfunction, Improper body mechanics, Hypomobility, Decreased range of motion, Decreased strength  Visit Diagnosis: Chronic left shoulder pain  Abnormal posture  Stiffness of left shoulder, not elsewhere classified  Muscle weakness (generalized)  Unsteadiness on feet     Problem List Patient Active Problem List   Diagnosis Date Noted  . HYPERTENSION 04/25/2009  . LOW BACK PAIN, ACUTE 04/25/2009  . DVT, HX OF 04/25/2009    Voncille Lo, PT Exercise Expert for the Aging Adult  01/14/17 1:34 PM Phone: [phone] Fax: [facility] [address] [place], [place], [address] Phone: [phone]   Fax:  [phone]  Name: Helen Hess MRN: [id number] Date of Birth: Apr 13, 1956

## 2017-01-19 ENCOUNTER — Ambulatory Visit: Payer: Medicare Other | Admitting: Physical Therapy

## 2017-01-19 ENCOUNTER — Other Ambulatory Visit: Payer: Self-pay | Admitting: Gastroenterology

## 2017-01-19 DIAGNOSIS — M25612 Stiffness of left shoulder, not elsewhere classified: Secondary | ICD-10-CM

## 2017-01-19 DIAGNOSIS — M25512 Pain in left shoulder: Principal | ICD-10-CM

## 2017-01-19 DIAGNOSIS — R2681 Unsteadiness on feet: Secondary | ICD-10-CM

## 2017-01-19 DIAGNOSIS — G8929 Other chronic pain: Secondary | ICD-10-CM

## 2017-01-19 DIAGNOSIS — M6281 Muscle weakness (generalized): Secondary | ICD-10-CM

## 2017-01-19 DIAGNOSIS — R293 Abnormal posture: Secondary | ICD-10-CM

## 2017-01-19 NOTE — Therapy (Signed)
[place], [place], [address] Phone: [phone]   Fax:  [phone]  Physical Therapy Treatment  Patient Details  Name: Helen Hess MRN: [id number] Date of Birth: May 04, 1956 Referring Provider: Corinna Capra MD  Encounter Date: 01/19/2017      PT End of Session - 01/19/17 1109    Visit Number 8   Number of Visits 16   Date for PT Re-Evaluation 02/16/17   Authorization Type UHC medicare   PT Start Time 1100   PT Stop Time 1145   PT Time Calculation (min) 45 min      Past Medical History:  Diagnosis Date  . Arthritis   . Depression   . H/O blood clots   . Hyperlipemia   . Hypertension     Past Surgical History:  Procedure Laterality Date  . CHOLECYSTECTOMY    . PARTIAL HYSTERECTOMY    . TONSILLECTOMY     child  . TUBAL LIGATION      There were no vitals filed for this visit.          [facility] PT Assessment - 01/19/17 0001      Observation/Other Assessments   Focus on Therapeutic Outcomes (FOTO)  50 % limitation improved from 77% on intake (pedicted 49%)      AROM   Left Shoulder Flexion 110 Degrees   Left Shoulder ABduction 40 Degrees   Left Shoulder Internal Rotation --  reach to L2   Left Shoulder External Rotation --  reach to back of head                     [facility] Adult PT Treatment/Exercise - 01/19/17 0001      Shoulder Exercises: Standing   Other Standing Exercises rockwood yellow band x 10   Other Standing Exercises standing cane flex, abdct, IR, also standing UE ranger on floor and wall to tolerance in flexion     Moist Heat Therapy   Number Minutes Moist Heat 15 Minutes   Moist Heat Location Shoulder     Electrical Stimulation   Electrical Stimulation Location left shoulder    Electrical Stimulation Action IFC   Electrical Stimulation Parameters 15   Electrical Stimulation Goals Pain                  PT Short Term Goals - 01/19/17  1136      PT SHORT TERM GOAL #1   Title "Independent with initial HEP   Baseline requires cues    Time 4   Period Weeks   Status On-going     PT SHORT TERM GOAL #2   Title "Report pain decrease from   10 /10 to   6 /10.   Baseline 3-4/10 pain with reaching    Period Weeks   Status Achieved     PT SHORT TERM GOAL #3   Title "Demonstrate understanding of proper sitting posture, body mechanics, work ergonomics, and be more conscious of position and posture throughout the day.    Baseline requires cues    Time 4   Period Weeks   Status On-going     PT SHORT TERM GOAL #4   Title Pt will increase AROM to at least 90 degrees abduction and flexion with pain at 5/10 or less   Baseline limited abduction, flexion 110   Time 4   Period Weeks   Status Partially Met  PT Long Term Goals - 12/22/16 1227      PT LONG TERM GOAL #1   Title "Pt will be independent with advanced HEP.    Time 8   Period Weeks   Status New     PT LONG TERM GOAL #2   Title "Pain will decrease to 4/10  or less with all functional activities   Time 8   Period Weeks   Status New     PT LONG TERM GOAL #3   Title LEft  shoulder AROM scaption will improve to 0-120 degrees for improved overhead reaching.    Time 8   Period Weeks   Status New     PT LONG TERM GOAL #4   Title Left shoulder IR and ER will return to [facility] in order  to return to ADLs such as dressing and grooming with at least 50% reduction in pain from eval   Time 8   Period Weeks   Status New     PT LONG TERM GOAL #5   Title "FOTO will improve from 77% limitation   to 49% limitation    indicating improved functional mobility .    Time 8   Period Weeks     Additional Long Term Goals   Additional Long Term Goals Yes     PT LONG TERM GOAL #6   Title Pt would like to return to washing dishes/household chores with at least 4/10 pain level or better to perform with increased comfort and ease from eval   Time 8   Period Weeks                Plan - 01/19/17 1145    Clinical Impression Statement pt reports no pain at rest and rates pain 3-4/10 with reaching. Shoulder flexion to 110 today AROM however abduction limited to 40 degrees. Pt was able to tolerate yellow band rockwood exercises. Addded to HEP. She reports she cannot reach the back of her shoulder to place electrodes for home TENS use. She also states the pads are shocking her. I asked her to stop using the TENS and bring it in next time. We need to review. She is also placing the electrodes together for storage instead of back on the plastic. We performed IFC in clinic today due to her inability to use at home for now. Pt reproted only fatigue with rockwood exercises. No increased pain. STG# 2 met, #4 partially met.    PT Next Visit Plan  Review how to use TENS ; continue supine cane exercises, Try UE ranger; add standing cane to HEP; review and assess tolerance to yelloe band rockwood HEP    PT Home Exercise Plan supine cane , isometrics , rockwood    Consulted and Agree with Plan of Care Patient      Patient will benefit from skilled therapeutic intervention in order to improve the following deficits and impairments:  Cardiopulmonary status limiting activity, Pain, Obesity, Impaired UE functional use, Postural dysfunction, Improper body mechanics, Hypomobility, Decreased range of motion, Decreased strength  Visit Diagnosis: Chronic left shoulder pain  Abnormal posture  Stiffness of left shoulder, not elsewhere classified  Muscle weakness (generalized)  Unsteadiness on feet     Problem List Patient Active Problem List   Diagnosis Date Noted  . HYPERTENSION 04/25/2009  . LOW BACK PAIN, ACUTE 04/25/2009  . DVT, HX OF 04/25/2009    Dorene Ar , PTA 01/19/2017, 11:58 AM  [facility] [address]  [place], [place], [address] Phone: [phone]   Fax:  [phone]  Name: Helen Hess MRN: [id number] Date of Birth: 03-27-1956

## 2017-01-21 ENCOUNTER — Encounter: Payer: Self-pay | Admitting: Physical Therapy

## 2017-01-21 ENCOUNTER — Ambulatory Visit: Payer: Medicare Other | Admitting: Physical Therapy

## 2017-01-21 ENCOUNTER — Telehealth: Payer: Self-pay | Admitting: Physical Therapy

## 2017-01-21 DIAGNOSIS — R2681 Unsteadiness on feet: Secondary | ICD-10-CM

## 2017-01-21 DIAGNOSIS — M25612 Stiffness of left shoulder, not elsewhere classified: Secondary | ICD-10-CM

## 2017-01-21 DIAGNOSIS — M6281 Muscle weakness (generalized): Secondary | ICD-10-CM

## 2017-01-21 DIAGNOSIS — M25512 Pain in left shoulder: Principal | ICD-10-CM

## 2017-01-21 DIAGNOSIS — R293 Abnormal posture: Secondary | ICD-10-CM

## 2017-01-21 DIAGNOSIS — G8929 Other chronic pain: Secondary | ICD-10-CM

## 2017-01-21 NOTE — Therapy (Addendum)
[place], [place], [address] Phone: [phone]   Fax:  [phone]  Physical Therapy Treatment/Discharge Note  Patient Details  Name: Helen Hess MRN: [id number] Date of Birth: 1956/01/02 Referring Provider: Corinna Capra MD  Encounter Date: 01/21/2017      PT End of Session - 01/21/17 1151    Visit Number 9   Number of Visits 16   Date for PT Re-Evaluation 02/16/17   Authorization Type UHC medicare   PT Start Time 1106   PT Stop Time 1150   PT Time Calculation (min) 44 min   Activity Tolerance Patient limited by pain   Behavior During Therapy [facility] for tasks assessed/performed;Anxious      Past Medical History:  Diagnosis Date  . Arthritis   . Depression   . H/O blood clots   . Hyperlipemia   . Hypertension     Past Surgical History:  Procedure Laterality Date  . CHOLECYSTECTOMY    . PARTIAL HYSTERECTOMY    . TONSILLECTOMY     child  . TUBAL LIGATION      There were no vitals filed for this visit.      Subjective Assessment - 01/21/17 1109    Subjective I like the TENS but I am having trouble putting it on at home   Pertinent History PT with pain in multiple locations back, shoulder left greater than right, neck Pt being seen by [facility]. uses CPAP machine,  depression   Limitations Lifting;House hold activities   Currently in Pain? Yes   Pain Score 6   when reaching for items   Pain Location Shoulder   Pain Orientation Left;Right  left worse than right   Pain Descriptors / Indicators Sore   Pain Type Chronic pain   Pain Onset More than a month ago   Pain Frequency Intermittent            [facility] PT Assessment - 01/21/17 0001      Observation/Other Assessments   Focus on Therapeutic Outcomes (FOTO)  50 % limitation improved from 77% on intake (pedicted 49%)   taken on 01-19-17                     [facility] Adult PT Treatment/Exercise - 01/21/17  1129      Self-Care   Self-Care Other Self-Care Comments   Other Self-Care Comments  TENS application review     Shoulder Exercises: Standing   Other Standing Exercises rockwood yellow band x 10  VC TC for keeping elbow straight with ext   Other Standing Exercises standing cane flex, abdct, IR, x 10 x 2 right and left also standing UE ranger on floor and wall to tolerance in flexion and abduction while using TEnS unit for pain control     Iontophoresis   Type of Iontophoresis Dexamethasone   Location anterior left shoulder   Dose 1cc on patch   Time patch to be removed in 4 to 6 hours pt given handout with instruction and verbalized understanding     Manual Therapy   Manual Therapy Soft tissue mobilization;Joint mobilization   Joint Mobilization left post/inf joint mobs grade 2 on left  Pt tolerated during mobilization but with inc pain after.    Soft tissue mobilization upper trap and RTC muscles/.   Pt with hypersensitivity on left ant shoulder                PT Education -  01/21/17 1143    Education provided Yes   Education Details added HEP with standing cane exercises with TENS use,  Iontophoresis patch to anterior shoulder with instructions given in handout   Person(s) Educated Patient   Methods Explanation;Demonstration;Verbal cues;Handout;Tactile cues   Comprehension Verbalized understanding;Returned demonstration          PT Short Term Goals - 01/21/17 1157      PT SHORT TERM GOAL #1   Title "Independent with initial HEP   Baseline requires cues    Time 4   Period Weeks   Status On-going     PT SHORT TERM GOAL #2   Title "Report pain decrease from   10 /10 to   6 /10.   Baseline 3-4/10 pain with reaching    Time 4   Period Weeks   Status Achieved     PT SHORT TERM GOAL #3   Title "Demonstrate understanding of proper sitting posture, body mechanics, work ergonomics, and be more conscious of position and posture throughout the day.    Baseline  requires cues    Time 4     PT SHORT TERM GOAL #4   Title Pt will increase AROM to at least 90 degrees abduction and flexion with pain at 5/10 or less   Baseline limited abduction, flexion 115 Abd 60 pain 3-4/10   Time 4   Period Weeks   Status Partially Met           PT Long Term Goals - 01/21/17 1158      PT LONG TERM GOAL #1   Title "Pt will be independent with advanced HEP.    Time 8   Period Weeks   Status On-going     PT LONG TERM GOAL #2   Title "Pain will decrease to 4/10  or less with all functional activities   Baseline pain with reaching   Time 8   Period Weeks   Status On-going     PT LONG TERM GOAL #3   Title LEft  shoulder AROM scaption will improve to 0-120 degrees for improved overhead reaching.    Time 8   Period Weeks   Status On-going     PT LONG TERM GOAL #4   Title Left shoulder IR and ER will return to [facility] in order  to return to ADLs such as dressing and grooming with at least 50% reduction in pain from eval   Time 8   Period Weeks   Status On-going     PT LONG TERM GOAL #5   Title "FOTO will improve from 77% limitation   to 49% limitation    indicating improved functional mobility .                Plan - 01/21/17 1135    Clinical Impression Statement Pt reports no pain at rest and only 3-4/10 when reaching for objects,  shoulder flexion 115 AROM but abduction improved from 01-19-17 but only 62 degrees.  Pt HEP added standing cane exercises and rockwood yellow but needs VC TC for correct execution . Pt initially was reviewed  on how to administer TENS and how to reach therapeutic area. and how to care for electrodes.. Pt recieve grade 2 mobilization for tightend posterior capsule and soft tissue to upper trap but could not tolerate soft tissue on anterior shoulder, Pt given iontophoresis patch and education/handout for use today to decrease inflammation of area.  Will continue to moniotor.  Pt seemed to do  better with exercises alone and will  progress with exercise as pt can tolerate   Rehab Potential Good   PT Frequency 2x / week   PT Duration 8 weeks   PT Treatment/Interventions ADLs/Self Care Home Management;Cryotherapy;Electrical Stimulation;Iontophoresis 43m/ml Dexamethasone;Ultrasound;Moist Heat;Therapeutic exercise;Neuromuscular re-education;Patient/family education;Passive range of motion;Manual techniques;Dry needling;Taping;Other (comment)   PT Next Visit Plan  continue supine/standing cane exercises,  assess pain on left anterior shoulder next visit. Use wall glides with towel if able.  maintain yellow RTC rockwood. Assess standing cane techniique due to pt requriing VCTC to perform correclty   PT Home Exercise Plan supine cane , isometrics , rockwood standing   Consulted and Agree with Plan of Care Patient      Patient will benefit from skilled therapeutic intervention in order to improve the following deficits and impairments:  Cardiopulmonary status limiting activity, Pain, Obesity, Impaired UE functional use, Postural dysfunction, Improper body mechanics, Hypomobility, Decreased range of motion, Decreased strength  Visit Diagnosis: Chronic left shoulder pain  Abnormal posture  Stiffness of left shoulder, not elsewhere classified  Muscle weakness (generalized)  Unsteadiness on feet     Problem List Patient Active Problem List   Diagnosis Date Noted  . HYPERTENSION 04/25/2009  . LOW BACK PAIN, ACUTE 04/25/2009  . DVT, HX OF 04/25/2009   LVoncille Lo PT Exercise Expert for the Aging Adult  01/21/17 12:53 PM Phone: [phone]: [phone] [facility] Car[facility], moving and handling object 01-21-17 Current CK Discharge CK  [facility] [address]Phone: [phone]  Fax:  [phone] Name: Helen PHETTEPLACEMRN: [id number] of Birth: 209-03-1956 PHYSICAL THERAPY DISCHARGE SUMMARY  Visits from Start of Care:  9  Current functional level related to goals / functional outcomes: unknown   Remaining deficits: unknown   Education / Equipment: TENS and HEP Plan: Patient agrees to discharge.  Patient goals were partially met. Patient is being discharged due to not returning since the last visit.  ?????    Most of goals unable to assess due to pt not returning to clinic  LVoncille Lo PT Certified Exercise Expert for the Aging Adult  02/16/17 12:37 PM Phone: [phone]: [phone]

## 2017-01-21 NOTE — Patient Instructions (Addendum)
Flexion (Eccentric) - Active-Assist (Cane)   Use unaffected arm to push affected arm forward. Avoid hiking shoulder. Keep palm relaxed. Slowly lower affected arm for 3-5 seconds, increasing use of affected arm. 10___ reps per set, _2__ sets per day, _7__ days per week.  2-3 sessions a day  Copyright  VHI. All rights reserved  Cane Exercise: Abduction   Hold cane with right hand over end, palm-up, with other hand palm-down. Move arm out from side and up by pushing with other arm. Hold _3-5___ seconds. Repeat __10 x2__ times. Do __2-3__ sessions per day.  http://[email or web address].exer.us/81   Copyright  VHI. All rights reserved.      Cane Exercise: Extension / Internal Rotation   Stand holding cane behind back with both hands palm-up. Slide cane up spine toward head. Hold _3-5___ seconds. Repeat __10__ times. Do ___2-3_ sessions per day.  http://[email or web address].exer.us/85   Copyright  VHI. All rights reserved.  Kasandra Knudsen Exercise: Extension   Stand holding cane behind back with both hands palm-up. Lift the cane away from body. Hold __3-5__ seconds. Repeat _10 x 2___ times. Do __2-3__ sessions per day.  http://[email or web address].exer.us/83   Copyright  VHI. All rights reserved.   IONTOPHORESIS PATIENT PRECAUTIONS & CONTRAINDICATIONS:  . Redness under one or both electrodes can occur.  This characterized by a uniform redness that usually disappears within 12 hours of treatment. . Small pinhead size blisters may result in response to the drug.  Contact your physician if the problem persists more than 24 hours. . On rare occasions, iontophoresis therapy can result in temporary skin reactions such as rash, inflammation, irritation or burns.  The skin reactions may be the result of individual sensitivity to the ionic solution used, the condition of the skin at the start of treatment, reaction to the materials in the electrodes, allergies or sensitivity to dexamethasone, or a poor connection between the patch and your  skin.  Discontinue using iontophoresis if you have any of these reactions and report to your therapist. . Remove the Patch or electrodes if you have any undue sensation of pain or burning during the treatment and report discomfort to your therapist. . Tell your Therapist if you have had known adverse reactions to the application of electrical current. . If using the Patch, the LED light will turn off when treatment is complete and the patch can be removed.  Approximate treatment time is 1-3 hours.  Remove the patch when light goes off or after 6 hours. . The Patch can be worn during normal activity, however excessive motion where the electrodes have been placed can cause poor contact between the skin and the electrode or uneven electrical current resulting in greater risk of skin irritation. Marland Kitchen Keep out of the reach of children.   . DO NOT use if you have a cardiac pacemaker or any other electrically sensitive implanted device. . DO NOT use if you have a known sensitivity to dexamethasone. . DO NOT use during Magnetic Resonance Imaging (MRI). . DO NOT use over broken or compromised skin (e.g. sunburn, cuts, or acne) due to the increased risk of skin reaction. . DO NOT SHAVE over the area to be treated:  To establish good contact between the Patch and the skin, excessive hair may be clipped. . DO NOT place the Patch or electrodes on or over your eyes, directly over your heart, or brain. . DO NOT reuse the Patch or electrodes as this may cause burns to occur.   Voncille Lo, PT  Exercise Expert for the Aging Adult  01/21/17 11:27 AM Phone: [phone] Fax: [phone]

## 2017-01-21 NOTE — Telephone Encounter (Signed)
Left message for patient to check on her after treatment today and to remind her to remove her iontophoresis patch on anterior left shoulder between 4 and 6 pm today.  Return phone number left in case Mrs. Masser had any questions or concerns.  Voncille Lo, PT [facility] Adult  01/21/17 2:36 PM Phone: [phone] Fax: [phone]

## 2017-03-05 ENCOUNTER — Encounter (HOSPITAL_COMMUNITY): Payer: Self-pay | Admitting: *Deleted

## 2017-03-08 ENCOUNTER — Ambulatory Visit (HOSPITAL_COMMUNITY)
Admission: RE | Admit: 2017-03-08 | Discharge: 2017-03-08 | Disposition: A | Discharge status: Home / Self Care | Payer: Medicare Other | Source: Ambulatory Visit | Attending: Gastroenterology | Admitting: Gastroenterology

## 2017-03-08 ENCOUNTER — Ambulatory Visit (HOSPITAL_COMMUNITY): Payer: Medicare Other | Admitting: Anesthesiology

## 2017-03-08 ENCOUNTER — Encounter (HOSPITAL_COMMUNITY)
Admission: RE | Disposition: A | Discharge status: Home / Self Care | Payer: Self-pay | Source: Ambulatory Visit | Attending: Gastroenterology

## 2017-03-08 ENCOUNTER — Encounter (HOSPITAL_COMMUNITY): Payer: Self-pay | Admitting: *Deleted

## 2017-03-08 DIAGNOSIS — M549 Dorsalgia, unspecified: Secondary | ICD-10-CM | POA: Insufficient documentation

## 2017-03-08 DIAGNOSIS — Z85828 Personal history of other malignant neoplasm of skin: Secondary | ICD-10-CM | POA: Diagnosis not present

## 2017-03-08 DIAGNOSIS — D123 Benign neoplasm of transverse colon: Secondary | ICD-10-CM | POA: Insufficient documentation

## 2017-03-08 DIAGNOSIS — Z86718 Personal history of other venous thrombosis and embolism: Secondary | ICD-10-CM | POA: Diagnosis not present

## 2017-03-08 DIAGNOSIS — Z7901 Long term (current) use of anticoagulants: Secondary | ICD-10-CM | POA: Diagnosis not present

## 2017-03-08 DIAGNOSIS — Z9049 Acquired absence of other specified parts of digestive tract: Secondary | ICD-10-CM | POA: Insufficient documentation

## 2017-03-08 DIAGNOSIS — Z9001 Acquired absence of eye: Secondary | ICD-10-CM | POA: Insufficient documentation

## 2017-03-08 DIAGNOSIS — K219 Gastro-esophageal reflux disease without esophagitis: Secondary | ICD-10-CM | POA: Diagnosis not present

## 2017-03-08 DIAGNOSIS — M109 Gout, unspecified: Secondary | ICD-10-CM | POA: Diagnosis not present

## 2017-03-08 DIAGNOSIS — M1990 Unspecified osteoarthritis, unspecified site: Secondary | ICD-10-CM | POA: Insufficient documentation

## 2017-03-08 DIAGNOSIS — Z1211 Encounter for screening for malignant neoplasm of colon: Secondary | ICD-10-CM | POA: Diagnosis not present

## 2017-03-08 DIAGNOSIS — Z800 Family history of malignant neoplasm of digestive organs: Secondary | ICD-10-CM | POA: Diagnosis not present

## 2017-03-08 DIAGNOSIS — J309 Allergic rhinitis, unspecified: Secondary | ICD-10-CM | POA: Insufficient documentation

## 2017-03-08 DIAGNOSIS — Z90710 Acquired absence of both cervix and uterus: Secondary | ICD-10-CM | POA: Insufficient documentation

## 2017-03-08 DIAGNOSIS — G4733 Obstructive sleep apnea (adult) (pediatric): Secondary | ICD-10-CM | POA: Diagnosis not present

## 2017-03-08 DIAGNOSIS — Z6835 Body mass index (BMI) 35.0-35.9, adult: Secondary | ICD-10-CM | POA: Diagnosis not present

## 2017-03-08 DIAGNOSIS — E669 Obesity, unspecified: Secondary | ICD-10-CM | POA: Insufficient documentation

## 2017-03-08 DIAGNOSIS — G8929 Other chronic pain: Secondary | ICD-10-CM | POA: Diagnosis not present

## 2017-03-08 DIAGNOSIS — Z888 Allergy status to other drugs, medicaments and biological substances status: Secondary | ICD-10-CM | POA: Diagnosis not present

## 2017-03-08 DIAGNOSIS — F329 Major depressive disorder, single episode, unspecified: Secondary | ICD-10-CM | POA: Insufficient documentation

## 2017-03-08 DIAGNOSIS — I10 Essential (primary) hypertension: Secondary | ICD-10-CM | POA: Insufficient documentation

## 2017-03-08 HISTORY — PX: COLONOSCOPY WITH PROPOFOL: SHX5780

## 2017-03-08 HISTORY — DX: Gastro-esophageal reflux disease without esophagitis: K21.9

## 2017-03-08 HISTORY — DX: Sleep apnea, unspecified: G47.30

## 2017-03-08 HISTORY — DX: Malignant (primary) neoplasm, unspecified: C80.1

## 2017-03-08 SURGERY — COLONOSCOPY WITH PROPOFOL
Anesthesia: Monitor Anesthesia Care

## 2017-03-08 MED ORDER — LACTATED RINGERS IV SOLN
INTRAVENOUS | Status: DC | PRN
  Administered 2017-03-08: 10:00:00 via INTRAVENOUS

## 2017-03-08 MED ORDER — PROPOFOL 10 MG/ML IV BOLUS
INTRAVENOUS | Status: AC
  Filled 2017-03-08: qty 40

## 2017-03-08 MED ORDER — SODIUM CHLORIDE 0.9 % IV SOLN: INTRAVENOUS | Status: DC

## 2017-03-08 MED ORDER — PROPOFOL 500 MG/50ML IV EMUL
INTRAVENOUS | Status: DC | PRN
  Administered 2017-03-08: 300 ug/kg/min via INTRAVENOUS

## 2017-03-08 SURGICAL SUPPLY — 22 items

## 2017-03-08 NOTE — Op Note (Signed)
[facility] Patient Name: Helen Hess Procedure Date: 03/08/2017 MRN: [id number] Attending MD: Garlan Fair , MD Date of Birth: [birth year] CSN: [id number] Age: 61 Admit Type: Outpatient Procedure:                Colonoscopy Indications:              Screening in patient at increased risk: Family                            history of 1st-degree relative with colorectal                            cancer before age 91 years Providers:                Garlan Fair, MD, Carmie End, RN, Cherylynn Ridges, Technician, Enrigue Catena, CRNA Referring MD:              Medicines:                Propofol per Anesthesia Complications:            No immediate complications. Estimated Blood Loss:     Estimated blood loss was minimal. Procedure:                Pre-Anesthesia Assessment:                           - Prior to the procedure, a History and Physical                            was performed, and patient medications and                            allergies were reviewed. The patient's tolerance of                            previous anesthesia was also reviewed. The risks                            and benefits of the procedure and the sedation                            options and risks were discussed with the patient.                            All questions were answered, and informed consent                            was obtained. Prior Anticoagulants: The patient has                            taken Xarelto (rivaroxaban), last dose was 4 days  prior to procedure. ASA Grade Assessment: II - A                            patient with mild systemic disease. After reviewing                            the risks and benefits, the patient was deemed in                            satisfactory condition to undergo the procedure.                           After obtaining informed consent, the colonoscope              was passed under direct vision. Throughout the                            procedure, the patient's blood pressure, pulse, and                            oxygen saturations were monitored continuously. The                            EC-[id number] ([id number]) scope was introduced through                            the anus and advanced to the the cecum, identified                            by appendiceal orifice and ileocecal valve. The                            colonoscopy was performed without difficulty. The                            patient tolerated the procedure well. The quality                            of the bowel preparation was good. The appendiceal                            orifice and the rectum were photographed. Scope In: 10:43:49 AM Scope Out: 11:04:35 AM Scope Withdrawal Time: 0 hours 14 minutes 21 seconds  Total Procedure Duration: 0 hours 20 minutes 46 seconds  Findings:      The perianal and digital rectal examinations were normal.      A 6 mm polyp was found in the proximal transverse colon. The polyp was       sessile. The polyp was removed with a cold snare and cold biopsy       forceps. Resection of the polyp was complete and a portion of the polyp       was complete for pathological evaluation.      The exam was otherwise without abnormality. Impression:               -  One 6 mm polyp in the proximal transverse colon,                            removed with a cold snare. Resected and retrieved.                           - The examination was otherwise normal. Moderate Sedation:      N/A- Per Anesthesia Care Recommendation:           - Patient has a contact number available for                            emergencies. The signs and symptoms of potential                            delayed complications were discussed with the                            patient. Return to normal activities tomorrow.                            Written discharge instructions  were provided to the                            patient.                           - Repeat colonoscopy in 5 years for surveillance.                           - Resume previous diet.                           - Continue present medications. Procedure Code(s):        --- Professional ---                           [phone], Colonoscopy, flexible; with removal of                            tumor(s), polyp(s), or other lesion(s) by snare                            technique Diagnosis Code(s):        --- Professional ---                           Z80.0, Family history of malignant neoplasm of                            digestive organs                           D12.3, Benign neoplasm of transverse colon (hepatic  flexure or splenic flexure) CPT copyright 2016 American Medical Association. All rights reserved. The codes documented in this report are preliminary and upon coder review may  be revised to meet current compliance requirements. Earle Gell, MD Garlan Fair, MD 03/08/2017 11:13:08 AM This report has been signed electronically. Number of Addenda: 0

## 2017-03-08 NOTE — Anesthesia Postprocedure Evaluation (Signed)
Anesthesia Post Note  Patient: Helen Hess  Procedure(s) Performed: Procedure(s) (LRB): COLONOSCOPY WITH PROPOFOL (N/A)  Patient location during evaluation: PACU Anesthesia Type: MAC Level of consciousness: awake and alert Pain management: pain level controlled Vital Signs Assessment: post-procedure vital signs reviewed and stable Respiratory status: spontaneous breathing, nonlabored ventilation and respiratory function stable Cardiovascular status: stable and blood pressure returned to baseline Anesthetic complications: no       Last Vitals:  Vitals:   03/08/17 1120 03/08/17 1130  BP: (!) 148/84 (!) 148/82  Pulse:    Resp: 17 16  Temp:      Last Pain:  Vitals:   03/08/17 1110  TempSrc: Oral                 Lynda Rainwater

## 2017-03-08 NOTE — Discharge Instructions (Signed)

## 2017-03-08 NOTE — Anesthesia Preprocedure Evaluation (Signed)
Anesthesia Evaluation  Patient identified by MRN, date of birth, ID band Patient awake    Reviewed: Allergy & Precautions, NPO status , Patient's Chart, lab work & pertinent test results  Airway Mallampati: II  TM Distance: >3 FB Neck ROM: Full    Dental no notable dental hx.    Pulmonary neg pulmonary ROS, sleep apnea ,    Pulmonary exam normal breath sounds clear to auscultation       Cardiovascular hypertension, Pt. on medications negative cardio ROS Normal cardiovascular exam Rhythm:Regular Rate:Normal     Neuro/Psych Depression negative neurological ROS  negative psych ROS   GI/Hepatic negative GI ROS, Neg liver ROS, GERD  ,  Endo/Other  negative endocrine ROS  Renal/GU negative Renal ROS  negative genitourinary   Musculoskeletal negative musculoskeletal ROS (+) Arthritis ,   Abdominal (+) + obese,   Peds negative pediatric ROS (+)  Hematology negative hematology ROS (+)   Anesthesia Other Findings   Reproductive/Obstetrics negative OB ROS                             Anesthesia Physical Anesthesia Plan  ASA: II  Anesthesia Plan: MAC   Post-op Pain Management:    Induction: Intravenous  Airway Management Planned: Simple Face Mask  Additional Equipment:   Intra-op Plan:   Post-operative Plan:   Informed Consent: I have reviewed the patients History and Physical, chart, labs and discussed the procedure including the risks, benefits and alternatives for the proposed anesthesia with the patient or authorized representative who has indicated his/her understanding and acceptance.   Dental advisory given  Plan Discussed with: CRNA  Anesthesia Plan Comments:         Anesthesia Quick Evaluation

## 2017-03-08 NOTE — Transfer of Care (Signed)
Immediate Anesthesia Transfer of Care Note  Patient: Helen Hess  Procedure(s) Performed: Procedure(s): COLONOSCOPY WITH PROPOFOL (N/A)  Patient Location: PACU  Anesthesia Type:MAC  Level of Consciousness: awake, alert , oriented and patient cooperative  Airway & Oxygen Therapy: Patient Spontanous Breathing and Patient connected to face mask oxygen  Post-op Assessment: Report given to RN, Post -op Vital signs reviewed and stable and Patient moving all extremities X 4  Post vital signs: stable  Last Vitals:  Vitals:   03/08/17 0949  BP: 133/74  Pulse: 73  Resp: 18  Temp: 37.2 C    Last Pain:  Vitals:   03/08/17 0949  TempSrc: Oral         Complications: No apparent anesthesia complications

## 2017-03-08 NOTE — H&P (Signed)
Procedure: Screening colonoscopy. Brother was diagnosed with colon cancer at age 62. Chronic xarelto anticoagulation to prevent recurrent deep venous thrombosis of the legs.  History: The patient is a 61 year old female born [birth year]. Her brother was diagnosed with colon cancer at age 61. She is scheduled to undergo a repeat screening colonoscopy today. She stopped taking xarelto 4 days ago.  Past medical history: Hypertension. 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 Depression. Gastroesophageal reflux. Seasonal allergic rhinitis. Gout. Cholecystectomy. Total abdominal hysterectomy. Appendectomy. Bilateral tubal ligation. Left eye removed surgically. Squamous cell skin cancer surgery on the thigh. Left carpal tunnel syndrome. Chronic back pain. Obstructive sleep apnea.  Medication allergies: Tramadol caused skin itching.  Exam: The patient is alert and lying comfortably on the endoscopy stretcher. Abdomen is soft and nontender to palpation. Lungs are clear to auscultation. Cardiac exam reveals a regular rhythm.  Plan: Proceed with screening colonoscopy

## 2017-03-10 ENCOUNTER — Encounter (HOSPITAL_COMMUNITY): Payer: Self-pay | Admitting: Gastroenterology

## 2017-09-01 IMAGING — CR DG CERVICAL SPINE COMPLETE 4+V
6 series · 6 of 6 positions shown · non-contrast
Comparison: 07/25/2014

CLINICAL DATA: Neck pain.  Fall at a [employer].

EXAM:
CERVICAL SPINE - COMPLETE 4+ VIEW

[w cervical spine lat]
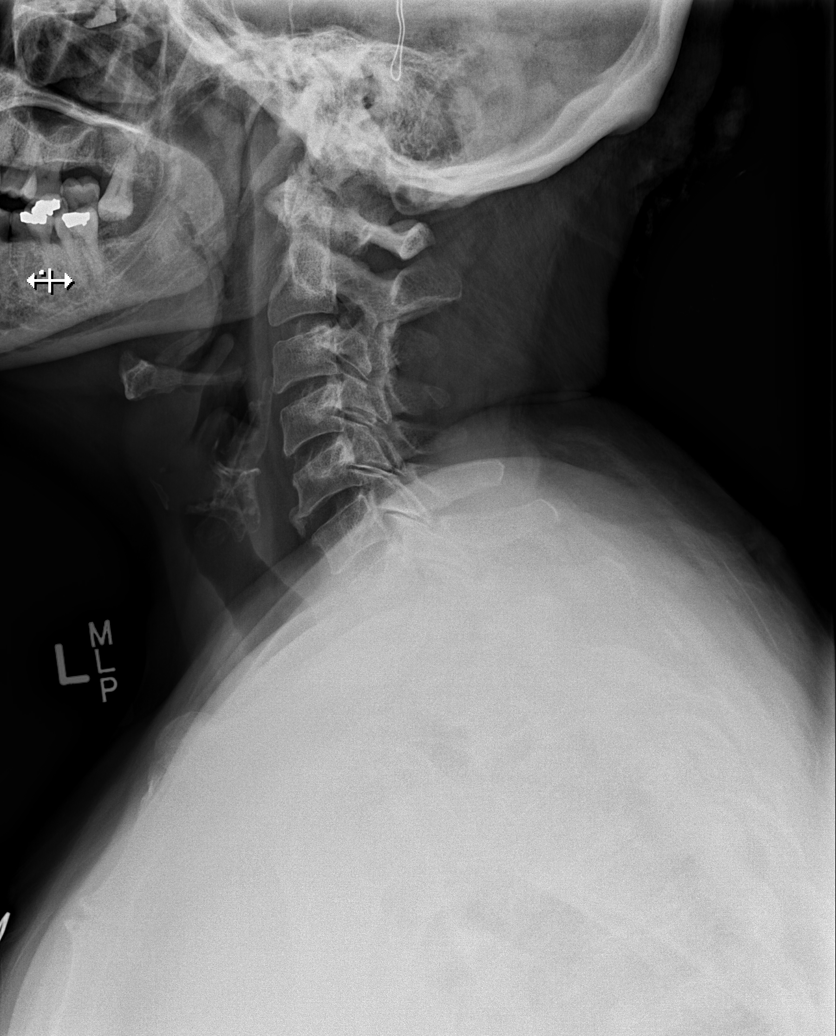

[w cervical spine ap_obl (1 of 3)]
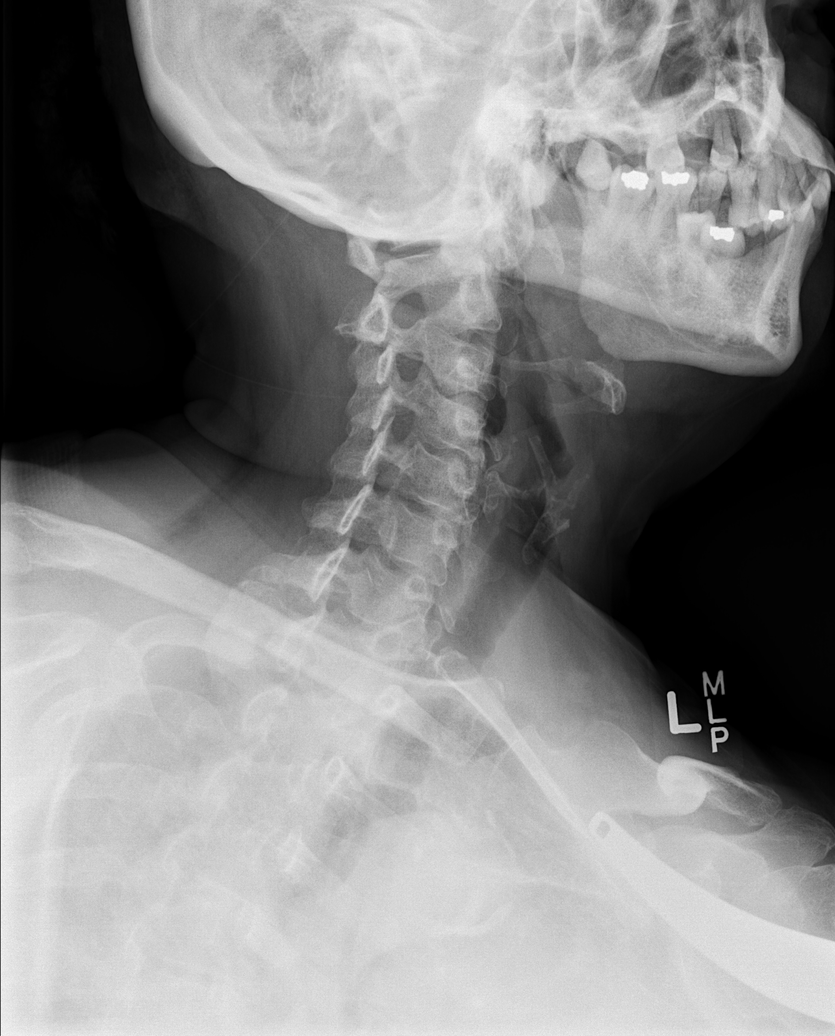

[w cervical spine ap_obl (2 of 3)]
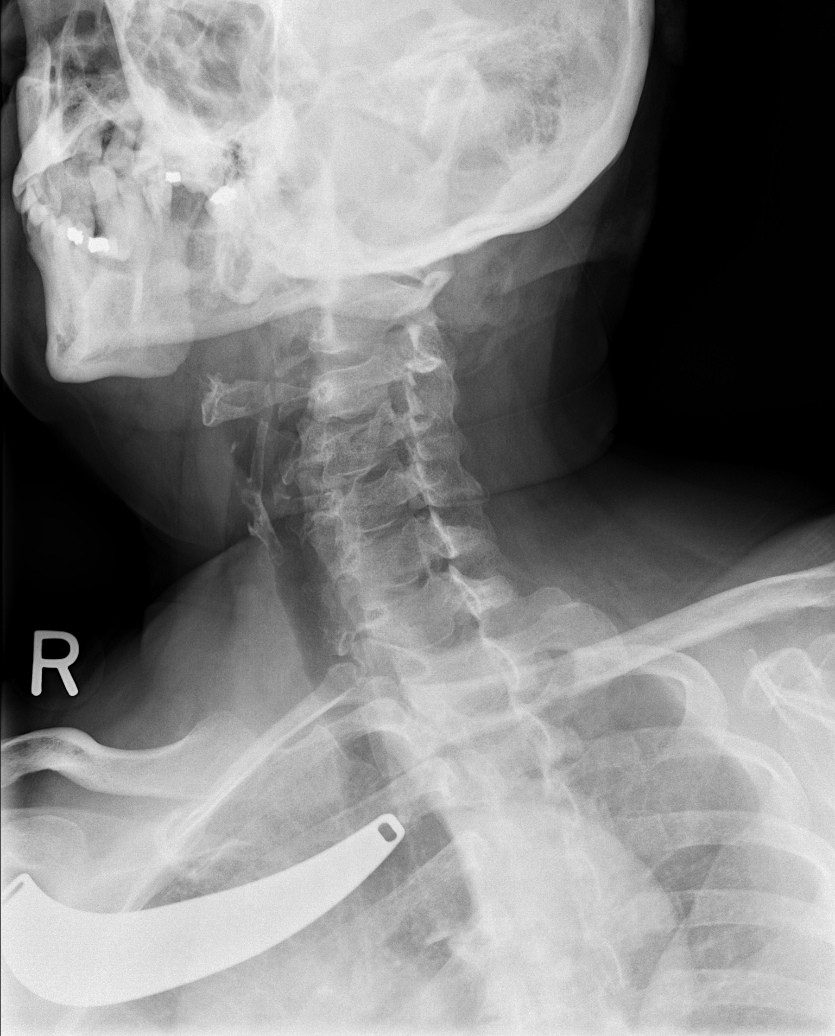

[w cervical spine ap_obl (3 of 3)]
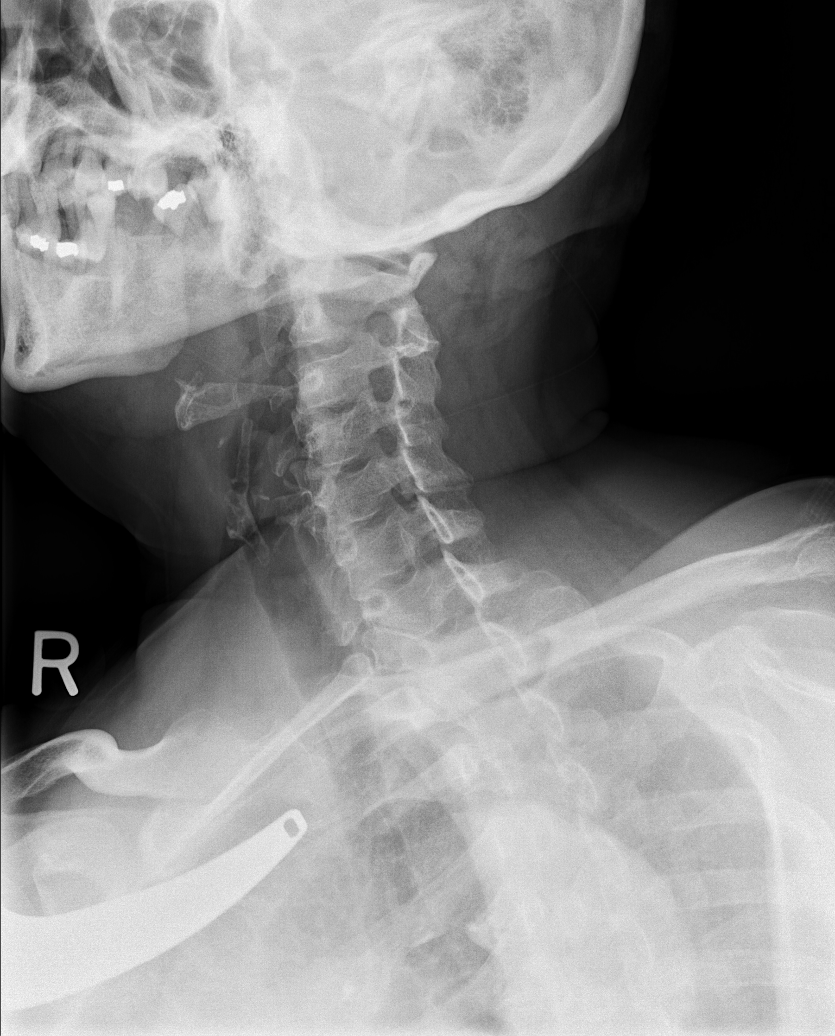

[w cervical spine ap]
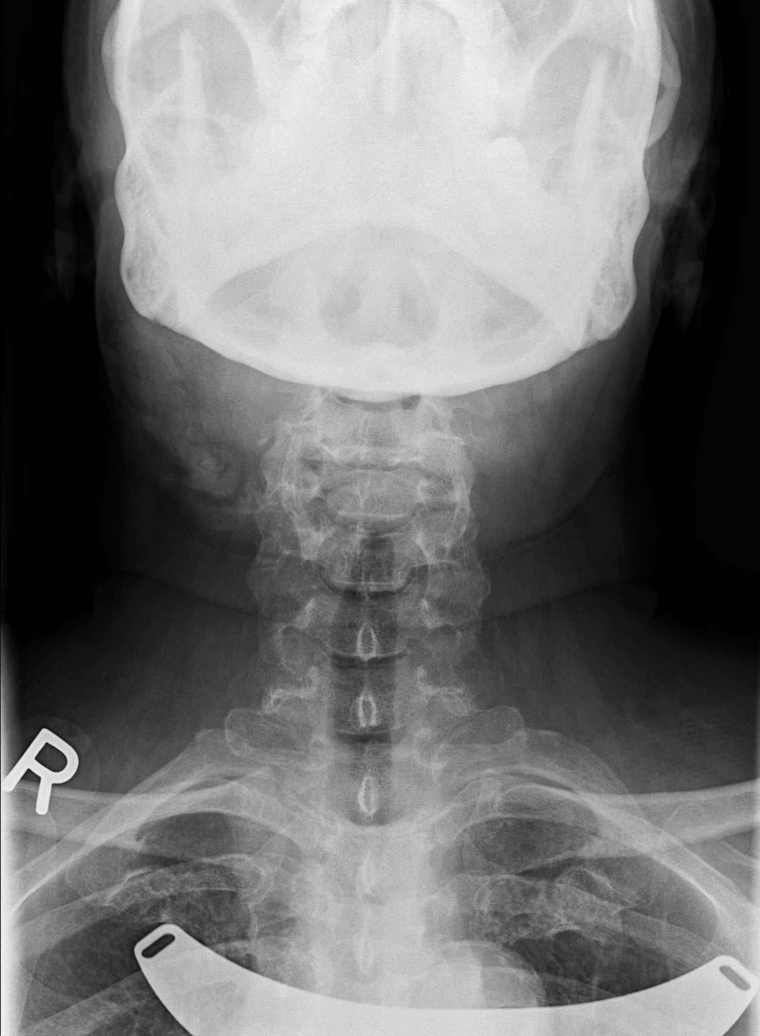

[w cervical spine odontoid]
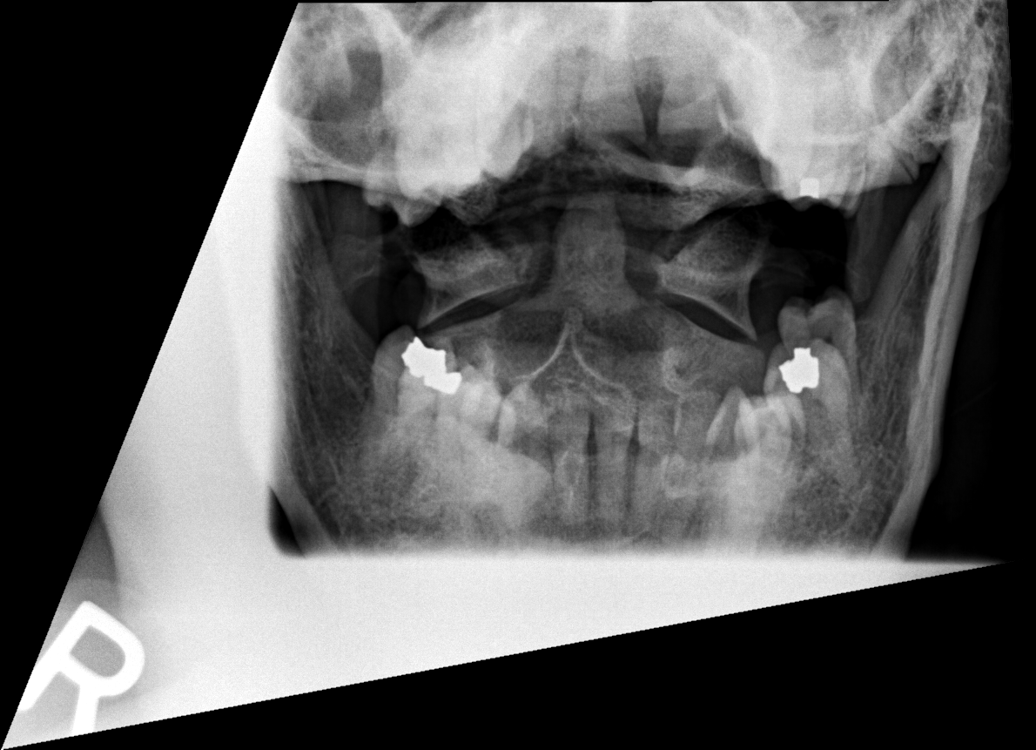

[6 of 6 positions shown; findings below may reference images not displayed]

FINDINGS: Normal alignment. No fracture. Degenerative spurring anteriorly at
C6-7. Disc spaces are maintained. Prevertebral soft tissues are
normal.
IMPRESSION: No acute bony abnormality.

## 2017-10-11 ENCOUNTER — Other Ambulatory Visit: Payer: Self-pay | Admitting: Internal Medicine

## 2017-10-11 DIAGNOSIS — Z1231 Encounter for screening mammogram for malignant neoplasm of breast: Secondary | ICD-10-CM

## 2017-11-23 ENCOUNTER — Ambulatory Visit: Payer: Self-pay

## 2017-12-22 ENCOUNTER — Ambulatory Visit
Admission: RE | Admit: 2017-12-22 | Discharge: 2017-12-22 | Disposition: A | Discharge status: Home / Self Care | Payer: Medicare Other | Source: Ambulatory Visit | Attending: Internal Medicine | Admitting: Internal Medicine

## 2017-12-22 DIAGNOSIS — Z1231 Encounter for screening mammogram for malignant neoplasm of breast: Secondary | ICD-10-CM

## 2017-12-23 ENCOUNTER — Other Ambulatory Visit: Payer: Self-pay | Admitting: Internal Medicine

## 2017-12-23 DIAGNOSIS — R928 Other abnormal and inconclusive findings on diagnostic imaging of breast: Secondary | ICD-10-CM

## 2018-02-25 ENCOUNTER — Other Ambulatory Visit: Payer: Self-pay | Admitting: Internal Medicine

## 2018-02-25 ENCOUNTER — Ambulatory Visit
Admission: RE | Admit: 2018-02-25 | Discharge: 2018-02-25 | Disposition: A | Discharge status: Home / Self Care | Payer: Medicare Other | Source: Ambulatory Visit | Attending: Internal Medicine | Admitting: Internal Medicine

## 2018-02-25 DIAGNOSIS — R921 Mammographic calcification found on diagnostic imaging of breast: Secondary | ICD-10-CM

## 2018-02-25 DIAGNOSIS — R928 Other abnormal and inconclusive findings on diagnostic imaging of breast: Secondary | ICD-10-CM

## 2018-07-19 ENCOUNTER — Ambulatory Visit: Payer: Medicare Other | Attending: Physical Medicine and Rehabilitation | Admitting: Physical Therapy

## 2018-07-19 ENCOUNTER — Other Ambulatory Visit: Payer: Self-pay

## 2018-07-19 ENCOUNTER — Encounter: Payer: Self-pay | Admitting: Physical Therapy

## 2018-07-19 DIAGNOSIS — M25512 Pain in left shoulder: Secondary | ICD-10-CM | POA: Insufficient documentation

## 2018-07-19 DIAGNOSIS — R293 Abnormal posture: Secondary | ICD-10-CM | POA: Insufficient documentation

## 2018-07-19 DIAGNOSIS — G8929 Other chronic pain: Secondary | ICD-10-CM | POA: Diagnosis present

## 2018-07-19 DIAGNOSIS — M6281 Muscle weakness (generalized): Secondary | ICD-10-CM | POA: Diagnosis present

## 2018-07-19 DIAGNOSIS — R2681 Unsteadiness on feet: Secondary | ICD-10-CM | POA: Diagnosis present

## 2018-07-19 DIAGNOSIS — M25612 Stiffness of left shoulder, not elsewhere classified: Secondary | ICD-10-CM | POA: Diagnosis present

## 2018-07-19 NOTE — Therapy (Addendum)
[place], [place], [address] Phone: [phone]   Fax:  [phone]  Physical Therapy Evaluation  Patient Details  Name: Helen Hess MRN: [id number] Date of Birth: 11-Jan-1956 Referring Provider: Margaretha Sheffield, MD   Encounter Date: 07/19/2018  PT End of Session - 07/19/18 0937    Visit Number  1    Number of Visits  13    Date for PT Re-Evaluation  08/30/18    PT Start Time  0932    PT Stop Time  1018    PT Time Calculation (min)  46 min    Activity Tolerance  Patient tolerated treatment well;Patient limited by pain    Behavior During Therapy  [facility] for tasks assessed/performed       Past Medical History:  Diagnosis Date  . Arthritis   . Cancer ([place])    skin cancers areas removed  . Depression   . GERD (gastroesophageal reflux disease)   . H/O blood clots   . Hyperlipemia   . Hypertension   . Sleep apnea    uses cpap setting of 6    Past Surgical History:  Procedure Laterality Date  . CHOLECYSTECTOMY    . COLONOSCOPY WITH PROPOFOL N/A 03/08/2017   Procedure: COLONOSCOPY WITH PROPOFOL;  Surgeon: Garlan Fair, MD;  Location: [facility] ENDOSCOPY;  Service: Endoscopy;  Laterality: N/A;  . PARTIAL HYSTERECTOMY    . TONSILLECTOMY     child  . TUBAL LIGATION      There were no vitals filed for this visit.   Subjective Assessment - 07/19/18 0937    Subjective  Pt reports she is having L shoulder pain, neck pain, and back pain. She states is feels stiff today. Her pain travels from her neck to her shoulder on both sides with L side being worse than R. Her pain has been constant since 2014. She injured her shoulder at work when she was lifting and she states her whole arm gave out and she could not use it. She states her pain starts in her neck and travels to the tips of her shoulders. She occassionally has numbness and tingling that goes into her arms.  She reports she has had acupuncture in the past  and that has helped her pain. The doctor in 2014/2015 told her she had 2 small RTC tears.     Limitations  Lifting    How long can you sit comfortably?  5 min    How long can you stand comfortably?  5 min    How long can you walk comfortably?  1 block - 30 min    Diagnostic tests  x-ray on shoulder (2015 or 2016)    Patient Stated Goals  less pain and stiffness    Currently in Pain?  Yes    Pain Score  7     Pain Location  Shoulder    Pain Orientation  Left;Right    Pain Descriptors / Indicators  Burning stiffness    Pain Type  Chronic pain    Pain Radiating Towards  neck into shoulders    Pain Onset  More than a month ago    Pain Frequency  Constant    Aggravating Factors   using her arm, weather    Pain Relieving Factors  medication and rest         [facility] PT Assessment - 07/19/18 0001      Assessment   Medical Diagnosis  L shoulder bursitis  Referring Provider  Margaretha Sheffield, MD    Onset Date/Surgical Date  -- 2014    Hand Dominance  Left    Next MD Visit  -- September    Prior Therapy  yes      Precautions   Precautions  None      Restrictions   Weight Bearing Restrictions  No      Balance Screen   Has the patient fallen in the past 6 months  Yes    How many times?  1    Has the patient had a decrease in activity level because of a fear of falling?   No    Is the patient reluctant to leave their home because of a fear of falling?   No      Home Environment   Living Environment  Private residence    Living Arrangements  Alone    Available Help at Discharge  Neighbor;Family neightbor will help when she can; daughter helps    Type of [place]  One level    [place] - single point;Crutches;Grab bars - tub/shower      Prior Function   Level of Independence  Independent with basic ADLs;Needs assistance with homemaking    Meal Prep  Minimal    Vacuuming  Maximal    Vocation  Retired    Leisure  doing  own housework, cooking      Cognition   Overall Cognitive Status  Within Functional Limits for tasks assessed      Observation/Other Assessments   Focus on Therapeutic Outcomes (FOTO)   54% limited predicted 40%      ROM / Strength   AROM / PROM / Strength  AROM;PROM;Strength      AROM   Right/Left Shoulder  Left;Right    Right Shoulder Flexion  118 Degrees pt reports arm feels heavy    Right Shoulder ABduction  89 Degrees slightly painful    Right Shoulder Internal Rotation  -- L4    Right Shoulder External Rotation  -- C7    Left Shoulder Flexion  96 Degrees painful throughout motion    Left Shoulder ABduction  79 Degrees painful and stiff     Left Shoulder Internal Rotation  -- can only reach L buttock; reports feeling of pulling    Left Shoulder External Rotation  -- can reach back of head      Strength   Right/Left Shoulder  Right;Left      Palpation   Palpation comment  ttp L upper trap      Special Tests   Rotator Cuff Impingment tests  Neer impingement test;Hawkins- Kennedy test;other      Hawkins-Kennedy test   Findings  Positive    Side  Left      other   Findings  Positive    Side  Left    Comments  scapular assistance test                Objective measurements completed on examination: See above findings.              PT Education - 07/19/18 1056    Education Details  examination findings, POC, HEP, dry needling and its benefits, shoulder anatomy and impingement, upper trap spasm    Person(s) Educated  Patient    Methods  Explanation;Handout    Comprehension  Verbalized understanding  PT Short Term Goals - 07/19/18 1045      PT SHORT TERM GOAL #1   Title  Pt will be I with HEP    Time  3    Period  Weeks    Status  New    Target Date  08/09/18      PT SHORT TERM GOAL #2   Title  Pt will demonstrate/verbalize proper posture to improve function and decrease pain to </= 2/10    Time  3    Period  Weeks    Status  New     Target Date  08/09/18        PT Long Term Goals - 07/19/18 1047      PT LONG TERM GOAL #1   Title  Pt will be able to sit, stand, and walk for >/=45 min in order to cook a meal and perform housework independently per pt goal    Time  6    Period  Weeks    Status  New    Target Date  08/30/18      PT LONG TERM GOAL #2   Title  Pt will increase bil shoulder flexion and abduction AROM by 15 degrees to improve function and decrease pain to </= 2/10    Time  6    Period  Weeks    Status  New    Target Date  08/30/18      PT LONG TERM GOAL #3   Title  Pt will decrease FOTO score to </= 40% limited to improve function and quality of life    Time  6    Period  Weeks    Status  New    Target Date  08/30/18      PT LONG TERM GOAL #4   Title  Pt will be I with HEP by final visit to ensure progress beyond physical therapy discharge    Time  6    Period  Weeks    Status  New    Target Date  08/30/18             Plan - 07/19/18 4801    Clinical Impression Statement  Pt is a 62 yo F referred to physical therapy for L shoulder bursitis. Pt reports she has constant pain in bil shoulders and neck, but the L shoulder is worse than the R. Upon examination, pt is ttp on her L upper trap. She is signifcantly decreased AROM in bil shoulder (L>R). She demonstrates kyphotic posture with rounded shoulders and a forward head. She has a positive Hawkins-Kennedy test and scapular assistance test on the L. Based on assessment, pt would benefit from OPPT services to address pain, ROM, strength, posture, and functional mobility.     History and Personal Factors relevant to plan of care:  arthritis, h/o cancer and blood clots, depression, HTN, lives alone    Clinical Presentation  Unstable    Clinical Presentation due to:  pain, decreased ROM and strength, abnormal posture, previous RTC injury    Clinical Decision Making  High    Rehab Potential  Good    PT Frequency  2x / week    PT Duration  6  weeks    PT Treatment/Interventions  ADLs/Self Care Home Management;Cryotherapy;Iontophoresis 4mg /ml Dexamethasone;Traction;Ultrasound;Functional mobility training;Therapeutic activities;Therapeutic exercise;Patient/family education;Neuromuscular re-education;Manual techniques;Taping;Dry needling;Passive range of motion    PT Next Visit Plan  H/O CANCER- NO ESTIM OR HEAT; HEP review, manual to upper trap prn, supine shoulder  ROM and strenthening as tolerated    PT Home Exercise Plan  upper trap stretch, scap retractions, chin tucks    Consulted and Agree with Plan of Care  Patient       Patient will benefit from skilled therapeutic intervention in order to improve the following deficits and impairments:  Impaired sensation, Improper body mechanics, Pain, Decreased mobility, Increased muscle spasms, Postural dysfunction, Decreased activity tolerance, Decreased endurance, Decreased range of motion, Decreased strength, Hypomobility, Impaired UE functional use  Visit Diagnosis: Chronic left shoulder pain  Abnormal posture  Stiffness of left shoulder, not elsewhere classified  Muscle weakness (generalized)     Problem List Patient Active Problem List   Diagnosis Date Noted  . HYPERTENSION 04/25/2009  . LOW BACK PAIN, ACUTE 04/25/2009  . DVT, HX OF 04/25/2009   Worthy Flank, SPT 07/19/18 1:32 PM   [facility] [address] [place], [place], [address] Phone: [phone]   Fax:  [phone]  Name: Helen Hess MRN: [id number] Date of Birth: 07-14-1956

## 2018-07-26 ENCOUNTER — Ambulatory Visit: Payer: Medicare Other | Admitting: Physical Therapy

## 2018-07-28 ENCOUNTER — Encounter: Payer: Self-pay | Admitting: Physical Therapy

## 2018-07-28 ENCOUNTER — Ambulatory Visit: Payer: Medicare Other | Admitting: Physical Therapy

## 2018-07-28 DIAGNOSIS — M6281 Muscle weakness (generalized): Secondary | ICD-10-CM

## 2018-07-28 DIAGNOSIS — G8929 Other chronic pain: Secondary | ICD-10-CM

## 2018-07-28 DIAGNOSIS — R293 Abnormal posture: Secondary | ICD-10-CM

## 2018-07-28 DIAGNOSIS — M25512 Pain in left shoulder: Secondary | ICD-10-CM | POA: Diagnosis not present

## 2018-07-28 DIAGNOSIS — M25612 Stiffness of left shoulder, not elsewhere classified: Secondary | ICD-10-CM

## 2018-07-28 NOTE — Therapy (Signed)
[place], [place], [address] Phone: [phone]   Fax:  [phone]  Physical Therapy Treatment  Patient Details  Name: Helen Hess MRN: [id number] Date of Birth: 05/29/1956 Referring Provider: Margaretha Sheffield, MD   Encounter Date: 07/28/2018  PT End of Session - 07/28/18 1110    Visit Number  2    Number of Visits  13    Date for PT Re-Evaluation  08/30/18    PT Start Time  1104    PT Stop Time  1145    PT Time Calculation (min)  41 min    Activity Tolerance  Patient tolerated treatment well    Behavior During Therapy  [facility] for tasks assessed/performed       Past Medical History:  Diagnosis Date  . Arthritis   . Cancer ([place])    skin cancers areas removed  . Depression   . GERD (gastroesophageal reflux disease)   . H/O blood clots   . Hyperlipemia   . Hypertension   . Sleep apnea    uses cpap setting of 6    Past Surgical History:  Procedure Laterality Date  . CHOLECYSTECTOMY    . COLONOSCOPY WITH PROPOFOL N/A 03/08/2017   Procedure: COLONOSCOPY WITH PROPOFOL;  Surgeon: Garlan Fair, MD;  Location: [facility];  Service: Endoscopy;  Laterality: N/A;  . PARTIAL HYSTERECTOMY    . TONSILLECTOMY     child  . TUBAL LIGATION      There were no vitals filed for this visit.  Subjective Assessment - 07/28/18 1107    Subjective  "My left shoulder is aching today."    Currently in Pain?  Yes    Pain Score  8     Pain Location  Shoulder    Pain Orientation  Right;Left    Pain Descriptors / Indicators  Sore    Pain Type  Chronic pain    Pain Radiating Towards  neck into shoulders    Pain Onset  More than a month ago    Pain Frequency  Constant    Aggravating Factors   exercises, weather    Pain Relieving Factors  medication, rest                       [facility] Adult PT Treatment/Exercise - 07/28/18 0001      Neck Exercises: Seated   Neck Retraction  10 reps;5 secs    cues for correct technique   Other Seated Exercise  supine shoulder ER x 10 with red theraband    Other Seated Exercise  attempted supine horizontal abduction; pt unable to complete      Shoulder Exercises: Supine   Flexion  5 reps;Left;Right   attempted; pt very weak     Manual Therapy   Manual Therapy  Soft tissue mobilization    Manual therapy comments  skilled palpation and monitoring during TPDN    Soft tissue mobilization  IASTM L upper trap and levator scap      Neck Exercises: Stretches   Upper Trapezius Stretch  30 seconds;Right;Left;1 rep       Trigger Point Dry Needling - 07/28/18 1118    Consent Given?  Yes    Education Handout Provided  No    Muscles Treated Upper Body  Upper trapezius;Levator scapulae    Upper Trapezius Response  Twitch reponse elicited;Palpable increased muscle length    Levator Scapulae Response  Twitch response elicited;Palpable increased muscle length  PT Education - 07/28/18 1217    Education Details  benefits of TPDN; importance of performing exercise in addition to TPDN, TPDN vs acupuncture    Person(s) Educated  Patient    Methods  Explanation    Comprehension  Verbalized understanding       PT Short Term Goals - 07/19/18 1045      PT SHORT TERM GOAL #1   Title  Pt will be I with HEP    Time  3    Period  Weeks    Status  New    Target Date  08/09/18      PT SHORT TERM GOAL #2   Title  Pt will demonstrate/verbalize proper posture to improve function and decrease pain to </= 2/10    Time  3    Period  Weeks    Status  New    Target Date  08/09/18        PT Long Term Goals - 07/19/18 1047      PT LONG TERM GOAL #1   Title  Pt will be able to sit, stand, and walk for >/=45 min in order to cook a meal and perform housework independently per pt goal    Time  6    Period  Weeks    Status  New    Target Date  08/30/18      PT LONG TERM GOAL #2   Title  Pt will increase bil shoulder flexion and abduction  AROM by 15 degrees to improve function and decrease pain to </= 2/10    Time  6    Period  Weeks    Status  New    Target Date  08/30/18      PT LONG TERM GOAL #3   Title  Pt will decrease FOTO score to </= 40% limited to improve function and quality of life    Time  6    Period  Weeks    Status  New    Target Date  08/30/18      PT LONG TERM GOAL #4   Title  Pt will be I with HEP by final visit to ensure progress beyond physical therapy discharge    Time  6    Period  Weeks    Status  New    Target Date  08/30/18            Plan - 07/28/18 1205    Clinical Impression Statement  Pt reports some increased soreness today. Pt educated on TPDN and agreed to try today. TPDN to L upper trap and levator scap performed followed by IASTM to same muscles. Attempted some strengthening following manual therapy; pt very weak and needs cues to keep shoulders down and not activate upper trap.  Pt declined ice or heat    PT Treatment/Interventions  ADLs/Self Care Home Management;Cryotherapy;Iontophoresis 4mg /ml Dexamethasone;Traction;Ultrasound;Functional mobility training;Therapeutic activities;Therapeutic exercise;Patient/family education;Neuromuscular re-education;Manual techniques;Taping;Dry needling;Passive range of motion    PT Next Visit Plan  H/O CANCER- NO ESTIM OR HEAT; ask how dry neelding was, HEP review, manual to upper trap prn, supine shoulder ROM and strenthening as tolerated    PT Home Exercise Plan  upper trap stretch, scap retractions, chin tucks    Consulted and Agree with Plan of Care  Patient       Patient will benefit from skilled therapeutic intervention in order to improve the following deficits and impairments:  Impaired sensation, Improper body mechanics, Pain, Decreased mobility, Increased muscle  spasms, Postural dysfunction, Decreased activity tolerance, Decreased endurance, Decreased range of motion, Decreased strength, Hypomobility, Impaired UE functional  use  Visit Diagnosis: Chronic left shoulder pain  Abnormal posture  Stiffness of left shoulder, not elsewhere classified  Muscle weakness (generalized)     Problem List Patient Active Problem List   Diagnosis Date Noted  . HYPERTENSION 04/25/2009  . LOW BACK PAIN, ACUTE 04/25/2009  . DVT, HX OF 04/25/2009   Worthy Flank, SPT 07/28/18 12:19 PM   [facility] [address] [place], [place], [address] Phone: [phone]   Fax:  [phone]  Name: Helen Hess MRN: [id number] Date of Birth: 27-Oct-1956

## 2018-08-02 ENCOUNTER — Ambulatory Visit: Payer: Medicare Other | Admitting: Physical Therapy

## 2018-08-02 ENCOUNTER — Encounter: Payer: Medicare Other | Admitting: Physical Therapy

## 2018-08-04 ENCOUNTER — Encounter: Payer: Self-pay | Admitting: Physical Therapy

## 2018-08-04 ENCOUNTER — Ambulatory Visit: Payer: Medicare Other | Admitting: Physical Therapy

## 2018-08-04 DIAGNOSIS — G8929 Other chronic pain: Secondary | ICD-10-CM

## 2018-08-04 DIAGNOSIS — M6281 Muscle weakness (generalized): Secondary | ICD-10-CM

## 2018-08-04 DIAGNOSIS — R293 Abnormal posture: Secondary | ICD-10-CM

## 2018-08-04 DIAGNOSIS — M25512 Pain in left shoulder: Principal | ICD-10-CM

## 2018-08-04 DIAGNOSIS — M25612 Stiffness of left shoulder, not elsewhere classified: Secondary | ICD-10-CM

## 2018-08-04 DIAGNOSIS — R2681 Unsteadiness on feet: Secondary | ICD-10-CM

## 2018-08-04 NOTE — Patient Instructions (Signed)

## 2018-08-04 NOTE — Therapy (Addendum)
[place], [place], [address] Phone: [phone]   Fax:  [phone]  Physical Therapy Treatment / Discharge summary  Patient Details  Name: SHASTINA RUA MRN: [id number] Date of Birth: 13-Oct-1956 Referring Provider: Margaretha Sheffield, MD   Encounter Date: 08/04/2018  PT End of Session - 08/04/18 1752    Visit Number  3    Number of Visits  13    Date for PT Re-Evaluation  08/30/18    PT Start Time  0803    PT Stop Time  0853    PT Time Calculation (min)  50 min    Activity Tolerance  Patient tolerated treatment well    Behavior During Therapy  [facility] for tasks assessed/performed       Past Medical History:  Diagnosis Date  . Arthritis   . Cancer ([place])    skin cancers areas removed  . Depression   . GERD (gastroesophageal reflux disease)   . H/O blood clots   . Hyperlipemia   . Hypertension   . Sleep apnea    uses cpap setting of 6    Past Surgical History:  Procedure Laterality Date  . CHOLECYSTECTOMY    . COLONOSCOPY WITH PROPOFOL N/A 03/08/2017   Procedure: COLONOSCOPY WITH PROPOFOL;  Surgeon: Garlan Fair, MD;  Location: [facility];  Service: Endoscopy;  Laterality: N/A;  . PARTIAL HYSTERECTOMY    . TONSILLECTOMY     child  . TUBAL LIGATION      There were no vitals filed for this visit.  Subjective Assessment - 08/04/18 0835    Subjective  No shoulder pain.  i have been in the bes last 2 days due to the weather    Currently in Pain?  No/denies    Pain Location  Shoulder    Pain Orientation  Left;Right    Pain Type  Chronic pain    Pain Radiating Towards  sometimes  into shoulders    Pain Frequency  Intermittent    Aggravating Factors   weather    Pain Relieving Factors  rest    Multiple Pain Sites  --   chronic constant low back pain  worse with chores,  better with meds and rest        [facility] PT Assessment - 08/04/18 0001      AROM   Right Shoulder Flexion  155  Degrees   supine                  [facility] Adult PT Treatment/Exercise - 08/04/18 0001      Self-Care   Self-Care  ADL's    ADL's  handout reviewed,  many things needing  to change      Shoulder Exercises: Supine   Other Supine Exercises  AROM shoulders, AAROM  tolerated better post manual.       Manual Therapy   Manual Therapy  Soft tissue mobilization    Soft tissue mobilization  tissue softened,  prone periscapular  teres tender bilateral     extra time with ROM/ soft tissue            PT Education - 08/04/18 1751    Education Details  ADL handout  for posture importance to shoulder etc.    Person(s) Educated  Patient    Methods  Explanation;Verbal cues;Handout    Comprehension  Verbalized understanding       PT Short Term Goals - 07/19/18 1045  PT SHORT TERM GOAL #1   Title  Pt will be I with HEP    Time  3    Period  Weeks    Status  New    Target Date  08/09/18      PT SHORT TERM GOAL #2   Title  Pt will demonstrate/verbalize proper posture to improve function and decrease pain to </= 2/10    Time  3    Period  Weeks    Status  New    Target Date  08/09/18        PT Long Term Goals - 07/19/18 1047      PT LONG TERM GOAL #1   Title  Pt will be able to sit, stand, and walk for >/=45 min in order to cook a meal and perform housework independently per pt goal    Time  6    Period  Weeks    Status  New    Target Date  08/30/18      PT LONG TERM GOAL #2   Title  Pt will increase bil shoulder flexion and abduction AROM by 15 degrees to improve function and decrease pain to </= 2/10    Time  6    Period  Weeks    Status  New    Target Date  08/30/18      PT LONG TERM GOAL #3   Title  Pt will decrease FOTO score to </= 40% limited to improve function and quality of life    Time  6    Period  Weeks    Status  New    Target Date  08/30/18      PT LONG TERM GOAL #4   Title  Pt will be I with HEP by final visit to ensure progress  beyond physical therapy discharge    Time  6    Period  Weeks    Status  New    Target Date  08/30/18            Plan - 08/04/18 1754    Clinical Impression Statement  155 supine AROM post session,  less pain noted. teres softened anf this made the most difference.    PT Next Visit Plan  H/O CANCER- NO ESTIM OR HEAT; ask how dry neelding was, HEP review, manual to upper trap prn, supine shoulder ROM and strenthening as tolerated    PT Home Exercise Plan  upper trap stretch, scap retractions, chin tucks    Consulted and Agree with Plan of Care  Patient       Patient will benefit from skilled therapeutic intervention in order to improve the following deficits and impairments:     Visit Diagnosis: Chronic left shoulder pain  Abnormal posture  Stiffness of left shoulder, not elsewhere classified  Muscle weakness (generalized)  Unsteadiness on feet     Problem List Patient Active Problem List   Diagnosis Date Noted  . HYPERTENSION 04/25/2009  . LOW BACK PAIN, ACUTE 04/25/2009  . DVT, HX OF 04/25/2009    Beata Beason PTA 08/04/2018, 5:56 PM  [facility] [address] [place], [place], [address] Phone: [phone]   Fax:  [phone]  Name: PAITEN BOIES MRN: [id number] Date of Birth: 19-Jan-1956       PHYSICAL THERAPY DISCHARGE SUMMARY  Visits from Start of Care: 3  Current functional level related to goals / functional outcomes: See goals, 39% limited    Remaining deficits:  See above assessment   Education / Equipment: HEP, theraband, posture education  Plan: Patient agrees to discharge.  Patient goals were partially met. Patient is being discharged due to being pleased with the current functional level.  ?????          Kristoffer Leamon PT, DPT, LAT, ATC  08/09/18  3:49 PM

## 2018-08-09 ENCOUNTER — Ambulatory Visit: Payer: Medicare Other | Admitting: Physical Therapy

## 2018-08-09 ENCOUNTER — Telehealth: Payer: Self-pay | Admitting: Physical Therapy

## 2018-08-09 NOTE — Telephone Encounter (Signed)
Spoke with pt regarding missed appointment today. She stated that on her last visit she stated she felt good and didn't feel she needed to return to physical therapy and wanted to be discharged.

## 2018-08-11 ENCOUNTER — Ambulatory Visit: Payer: Medicare Other | Admitting: Physical Therapy

## 2018-08-16 ENCOUNTER — Ambulatory Visit: Payer: Medicare Other | Admitting: Physical Therapy

## 2018-08-18 ENCOUNTER — Ambulatory Visit: Payer: Medicare Other | Admitting: Physical Therapy

## 2018-08-23 ENCOUNTER — Ambulatory Visit: Payer: Medicare Other | Admitting: Physical Therapy

## 2018-08-25 ENCOUNTER — Encounter: Payer: Medicare Other | Admitting: Physical Therapy

## 2018-08-30 ENCOUNTER — Encounter: Payer: Medicare Other | Admitting: Physical Therapy

## 2018-09-01 ENCOUNTER — Encounter: Payer: Medicare Other | Admitting: Physical Therapy

## 2018-10-06 ENCOUNTER — Ambulatory Visit
Admission: RE | Admit: 2018-10-06 | Discharge: 2018-10-06 | Disposition: A | Discharge status: Home / Self Care | Payer: Medicare Other | Source: Ambulatory Visit | Attending: Internal Medicine | Admitting: Internal Medicine

## 2018-10-06 DIAGNOSIS — R921 Mammographic calcification found on diagnostic imaging of breast: Secondary | ICD-10-CM

## 2019-01-09 ENCOUNTER — Other Ambulatory Visit: Payer: Self-pay | Admitting: Internal Medicine

## 2019-01-09 DIAGNOSIS — Z1231 Encounter for screening mammogram for malignant neoplasm of breast: Secondary | ICD-10-CM

## 2019-01-10 ENCOUNTER — Ambulatory Visit
Admission: RE | Admit: 2019-01-10 | Discharge: 2019-01-10 | Disposition: A | Discharge status: Home / Self Care | Payer: Medicare Other | Source: Ambulatory Visit | Attending: Internal Medicine | Admitting: Internal Medicine

## 2019-01-10 DIAGNOSIS — Z1231 Encounter for screening mammogram for malignant neoplasm of breast: Secondary | ICD-10-CM

## 2019-07-01 ENCOUNTER — Other Ambulatory Visit: Payer: Self-pay | Admitting: Physical Medicine and Rehabilitation

## 2019-07-01 DIAGNOSIS — M5416 Radiculopathy, lumbar region: Secondary | ICD-10-CM

## 2019-07-18 ENCOUNTER — Ambulatory Visit
Admission: RE | Admit: 2019-07-18 | Discharge: 2019-07-18 | Disposition: A | Discharge status: Home / Self Care | Payer: Medicare Other | Source: Ambulatory Visit | Attending: Physical Medicine and Rehabilitation | Admitting: Physical Medicine and Rehabilitation

## 2019-07-18 ENCOUNTER — Other Ambulatory Visit: Payer: Self-pay

## 2019-07-18 DIAGNOSIS — M5416 Radiculopathy, lumbar region: Secondary | ICD-10-CM

## 2019-11-16 ENCOUNTER — Other Ambulatory Visit: Payer: Self-pay | Admitting: Internal Medicine

## 2019-11-16 DIAGNOSIS — Z1231 Encounter for screening mammogram for malignant neoplasm of breast: Secondary | ICD-10-CM

## 2020-02-26 ENCOUNTER — Other Ambulatory Visit: Payer: Self-pay

## 2020-02-26 ENCOUNTER — Ambulatory Visit
Admission: RE | Admit: 2020-02-26 | Discharge: 2020-02-26 | Disposition: A | Discharge status: Home / Self Care | Payer: Medicare Other | Source: Ambulatory Visit | Attending: Internal Medicine | Admitting: Internal Medicine

## 2020-02-26 DIAGNOSIS — Z1231 Encounter for screening mammogram for malignant neoplasm of breast: Secondary | ICD-10-CM

## 2020-03-05 ENCOUNTER — Other Ambulatory Visit: Payer: Self-pay | Admitting: Internal Medicine

## 2020-03-05 ENCOUNTER — Ambulatory Visit
Admission: RE | Admit: 2020-03-05 | Discharge: 2020-03-05 | Disposition: A | Discharge status: Home / Self Care | Payer: Medicare Other | Source: Ambulatory Visit | Attending: Internal Medicine | Admitting: Internal Medicine

## 2020-03-05 DIAGNOSIS — R109 Unspecified abdominal pain: Secondary | ICD-10-CM

## 2020-12-18 DIAGNOSIS — R0989 Other specified symptoms and signs involving the circulatory and respiratory systems: Secondary | ICD-10-CM | POA: Diagnosis not present

## 2020-12-18 DIAGNOSIS — R432 Parageusia: Secondary | ICD-10-CM | POA: Diagnosis not present

## 2020-12-18 DIAGNOSIS — R059 Cough, unspecified: Secondary | ICD-10-CM | POA: Diagnosis not present

## 2020-12-18 DIAGNOSIS — R197 Diarrhea, unspecified: Secondary | ICD-10-CM | POA: Diagnosis not present

## 2020-12-19 DIAGNOSIS — R059 Cough, unspecified: Secondary | ICD-10-CM | POA: Diagnosis not present

## 2020-12-19 DIAGNOSIS — J069 Acute upper respiratory infection, unspecified: Secondary | ICD-10-CM | POA: Diagnosis not present

## 2020-12-19 DIAGNOSIS — Z03818 Encounter for observation for suspected exposure to other biological agents ruled out: Secondary | ICD-10-CM | POA: Diagnosis not present

## 2020-12-25 DIAGNOSIS — M5136 Other intervertebral disc degeneration, lumbar region: Secondary | ICD-10-CM | POA: Diagnosis not present

## 2020-12-25 DIAGNOSIS — I10 Essential (primary) hypertension: Secondary | ICD-10-CM | POA: Diagnosis not present

## 2020-12-25 DIAGNOSIS — Z85828 Personal history of other malignant neoplasm of skin: Secondary | ICD-10-CM | POA: Diagnosis not present

## 2020-12-25 DIAGNOSIS — M109 Gout, unspecified: Secondary | ICD-10-CM | POA: Diagnosis not present

## 2020-12-25 DIAGNOSIS — I82403 Acute embolism and thrombosis of unspecified deep veins of lower extremity, bilateral: Secondary | ICD-10-CM | POA: Diagnosis not present

## 2020-12-25 DIAGNOSIS — G4733 Obstructive sleep apnea (adult) (pediatric): Secondary | ICD-10-CM | POA: Diagnosis not present

## 2020-12-25 DIAGNOSIS — G894 Chronic pain syndrome: Secondary | ICD-10-CM | POA: Diagnosis not present

## 2020-12-25 DIAGNOSIS — E7800 Pure hypercholesterolemia, unspecified: Secondary | ICD-10-CM | POA: Diagnosis not present

## 2020-12-25 DIAGNOSIS — K219 Gastro-esophageal reflux disease without esophagitis: Secondary | ICD-10-CM | POA: Diagnosis not present

## 2020-12-25 DIAGNOSIS — N182 Chronic kidney disease, stage 2 (mild): Secondary | ICD-10-CM | POA: Diagnosis not present

## 2020-12-26 DIAGNOSIS — G894 Chronic pain syndrome: Secondary | ICD-10-CM | POA: Diagnosis not present

## 2020-12-26 DIAGNOSIS — G4700 Insomnia, unspecified: Secondary | ICD-10-CM | POA: Diagnosis not present

## 2020-12-26 DIAGNOSIS — M47817 Spondylosis without myelopathy or radiculopathy, lumbosacral region: Secondary | ICD-10-CM | POA: Diagnosis not present

## 2021-01-02 DIAGNOSIS — K219 Gastro-esophageal reflux disease without esophagitis: Secondary | ICD-10-CM | POA: Diagnosis not present

## 2021-01-02 DIAGNOSIS — M179 Osteoarthritis of knee, unspecified: Secondary | ICD-10-CM | POA: Diagnosis not present

## 2021-01-02 DIAGNOSIS — N182 Chronic kidney disease, stage 2 (mild): Secondary | ICD-10-CM | POA: Diagnosis not present

## 2021-01-02 DIAGNOSIS — I10 Essential (primary) hypertension: Secondary | ICD-10-CM | POA: Diagnosis not present

## 2021-01-02 DIAGNOSIS — E7800 Pure hypercholesterolemia, unspecified: Secondary | ICD-10-CM | POA: Diagnosis not present

## 2021-01-02 DIAGNOSIS — M1990 Unspecified osteoarthritis, unspecified site: Secondary | ICD-10-CM | POA: Diagnosis not present

## 2021-01-29 DIAGNOSIS — M1990 Unspecified osteoarthritis, unspecified site: Secondary | ICD-10-CM | POA: Diagnosis not present

## 2021-01-29 DIAGNOSIS — M179 Osteoarthritis of knee, unspecified: Secondary | ICD-10-CM | POA: Diagnosis not present

## 2021-01-29 DIAGNOSIS — K219 Gastro-esophageal reflux disease without esophagitis: Secondary | ICD-10-CM | POA: Diagnosis not present

## 2021-01-29 DIAGNOSIS — I10 Essential (primary) hypertension: Secondary | ICD-10-CM | POA: Diagnosis not present

## 2021-01-29 DIAGNOSIS — N182 Chronic kidney disease, stage 2 (mild): Secondary | ICD-10-CM | POA: Diagnosis not present

## 2021-01-29 DIAGNOSIS — E7800 Pure hypercholesterolemia, unspecified: Secondary | ICD-10-CM | POA: Diagnosis not present

## 2021-02-13 DIAGNOSIS — M47817 Spondylosis without myelopathy or radiculopathy, lumbosacral region: Secondary | ICD-10-CM | POA: Diagnosis not present

## 2021-02-13 DIAGNOSIS — G4700 Insomnia, unspecified: Secondary | ICD-10-CM | POA: Diagnosis not present

## 2021-02-13 DIAGNOSIS — G894 Chronic pain syndrome: Secondary | ICD-10-CM | POA: Diagnosis not present

## 2021-02-17 ENCOUNTER — Other Ambulatory Visit: Payer: Self-pay | Admitting: Internal Medicine

## 2021-02-17 DIAGNOSIS — Z1231 Encounter for screening mammogram for malignant neoplasm of breast: Secondary | ICD-10-CM

## 2021-03-13 DIAGNOSIS — M1990 Unspecified osteoarthritis, unspecified site: Secondary | ICD-10-CM | POA: Diagnosis not present

## 2021-03-13 DIAGNOSIS — K219 Gastro-esophageal reflux disease without esophagitis: Secondary | ICD-10-CM | POA: Diagnosis not present

## 2021-03-13 DIAGNOSIS — I10 Essential (primary) hypertension: Secondary | ICD-10-CM | POA: Diagnosis not present

## 2021-03-13 DIAGNOSIS — E7800 Pure hypercholesterolemia, unspecified: Secondary | ICD-10-CM | POA: Diagnosis not present

## 2021-03-13 DIAGNOSIS — M179 Osteoarthritis of knee, unspecified: Secondary | ICD-10-CM | POA: Diagnosis not present

## 2021-03-13 DIAGNOSIS — N182 Chronic kidney disease, stage 2 (mild): Secondary | ICD-10-CM | POA: Diagnosis not present

## 2021-03-14 DIAGNOSIS — G4733 Obstructive sleep apnea (adult) (pediatric): Secondary | ICD-10-CM | POA: Diagnosis not present

## 2021-03-28 ENCOUNTER — Inpatient Hospital Stay: Admission: RE | Admit: 2021-03-28 | Payer: Medicare Other | Source: Ambulatory Visit

## 2021-04-02 DIAGNOSIS — G894 Chronic pain syndrome: Secondary | ICD-10-CM | POA: Diagnosis not present

## 2021-04-02 DIAGNOSIS — M47817 Spondylosis without myelopathy or radiculopathy, lumbosacral region: Secondary | ICD-10-CM | POA: Diagnosis not present

## 2021-04-02 DIAGNOSIS — G4700 Insomnia, unspecified: Secondary | ICD-10-CM | POA: Diagnosis not present

## 2021-04-10 ENCOUNTER — Ambulatory Visit: Payer: Medicare Other

## 2021-04-15 DIAGNOSIS — N182 Chronic kidney disease, stage 2 (mild): Secondary | ICD-10-CM | POA: Diagnosis not present

## 2021-04-15 DIAGNOSIS — E7800 Pure hypercholesterolemia, unspecified: Secondary | ICD-10-CM | POA: Diagnosis not present

## 2021-04-15 DIAGNOSIS — M1990 Unspecified osteoarthritis, unspecified site: Secondary | ICD-10-CM | POA: Diagnosis not present

## 2021-04-15 DIAGNOSIS — K219 Gastro-esophageal reflux disease without esophagitis: Secondary | ICD-10-CM | POA: Diagnosis not present

## 2021-04-15 DIAGNOSIS — I10 Essential (primary) hypertension: Secondary | ICD-10-CM | POA: Diagnosis not present

## 2021-05-19 DIAGNOSIS — W57XXXA Bitten or stung by nonvenomous insect and other nonvenomous arthropods, initial encounter: Secondary | ICD-10-CM | POA: Diagnosis not present

## 2021-05-19 DIAGNOSIS — S1086XA Insect bite of other specified part of neck, initial encounter: Secondary | ICD-10-CM | POA: Diagnosis not present

## 2021-05-28 DIAGNOSIS — G894 Chronic pain syndrome: Secondary | ICD-10-CM | POA: Diagnosis not present

## 2021-05-28 DIAGNOSIS — Z79891 Long term (current) use of opiate analgesic: Secondary | ICD-10-CM | POA: Diagnosis not present

## 2021-05-28 DIAGNOSIS — G4700 Insomnia, unspecified: Secondary | ICD-10-CM | POA: Diagnosis not present

## 2021-05-28 DIAGNOSIS — M47817 Spondylosis without myelopathy or radiculopathy, lumbosacral region: Secondary | ICD-10-CM | POA: Diagnosis not present

## 2021-05-29 DIAGNOSIS — N182 Chronic kidney disease, stage 2 (mild): Secondary | ICD-10-CM | POA: Diagnosis not present

## 2021-05-29 DIAGNOSIS — M179 Osteoarthritis of knee, unspecified: Secondary | ICD-10-CM | POA: Diagnosis not present

## 2021-05-29 DIAGNOSIS — M1990 Unspecified osteoarthritis, unspecified site: Secondary | ICD-10-CM | POA: Diagnosis not present

## 2021-05-29 DIAGNOSIS — E7800 Pure hypercholesterolemia, unspecified: Secondary | ICD-10-CM | POA: Diagnosis not present

## 2021-05-29 DIAGNOSIS — I10 Essential (primary) hypertension: Secondary | ICD-10-CM | POA: Diagnosis not present

## 2021-05-29 DIAGNOSIS — K219 Gastro-esophageal reflux disease without esophagitis: Secondary | ICD-10-CM | POA: Diagnosis not present

## 2021-06-26 DIAGNOSIS — E7800 Pure hypercholesterolemia, unspecified: Secondary | ICD-10-CM | POA: Diagnosis not present

## 2021-06-26 DIAGNOSIS — M1990 Unspecified osteoarthritis, unspecified site: Secondary | ICD-10-CM | POA: Diagnosis not present

## 2021-06-26 DIAGNOSIS — K219 Gastro-esophageal reflux disease without esophagitis: Secondary | ICD-10-CM | POA: Diagnosis not present

## 2021-06-26 DIAGNOSIS — I10 Essential (primary) hypertension: Secondary | ICD-10-CM | POA: Diagnosis not present

## 2021-06-26 DIAGNOSIS — M179 Osteoarthritis of knee, unspecified: Secondary | ICD-10-CM | POA: Diagnosis not present

## 2021-06-26 DIAGNOSIS — N182 Chronic kidney disease, stage 2 (mild): Secondary | ICD-10-CM | POA: Diagnosis not present

## 2021-07-07 DIAGNOSIS — G4733 Obstructive sleep apnea (adult) (pediatric): Secondary | ICD-10-CM | POA: Diagnosis not present

## 2021-07-15 ENCOUNTER — Ambulatory Visit: Payer: Medicare Other

## 2021-07-17 DIAGNOSIS — N182 Chronic kidney disease, stage 2 (mild): Secondary | ICD-10-CM | POA: Diagnosis not present

## 2021-07-17 DIAGNOSIS — R413 Other amnesia: Secondary | ICD-10-CM | POA: Diagnosis not present

## 2021-07-17 DIAGNOSIS — M109 Gout, unspecified: Secondary | ICD-10-CM | POA: Diagnosis not present

## 2021-07-17 DIAGNOSIS — Z0000 Encounter for general adult medical examination without abnormal findings: Secondary | ICD-10-CM | POA: Diagnosis not present

## 2021-07-17 DIAGNOSIS — Z85828 Personal history of other malignant neoplasm of skin: Secondary | ICD-10-CM | POA: Diagnosis not present

## 2021-07-17 DIAGNOSIS — M5136 Other intervertebral disc degeneration, lumbar region: Secondary | ICD-10-CM | POA: Diagnosis not present

## 2021-07-17 DIAGNOSIS — E7800 Pure hypercholesterolemia, unspecified: Secondary | ICD-10-CM | POA: Diagnosis not present

## 2021-07-17 DIAGNOSIS — I82403 Acute embolism and thrombosis of unspecified deep veins of lower extremity, bilateral: Secondary | ICD-10-CM | POA: Diagnosis not present

## 2021-07-17 DIAGNOSIS — M1990 Unspecified osteoarthritis, unspecified site: Secondary | ICD-10-CM | POA: Diagnosis not present

## 2021-07-17 DIAGNOSIS — I10 Essential (primary) hypertension: Secondary | ICD-10-CM | POA: Diagnosis not present

## 2021-07-17 DIAGNOSIS — G4733 Obstructive sleep apnea (adult) (pediatric): Secondary | ICD-10-CM | POA: Diagnosis not present

## 2021-07-17 DIAGNOSIS — Z1389 Encounter for screening for other disorder: Secondary | ICD-10-CM | POA: Diagnosis not present

## 2021-07-17 DIAGNOSIS — K219 Gastro-esophageal reflux disease without esophagitis: Secondary | ICD-10-CM | POA: Diagnosis not present

## 2021-07-28 DIAGNOSIS — G4700 Insomnia, unspecified: Secondary | ICD-10-CM | POA: Diagnosis not present

## 2021-07-28 DIAGNOSIS — M47817 Spondylosis without myelopathy or radiculopathy, lumbosacral region: Secondary | ICD-10-CM | POA: Diagnosis not present

## 2021-07-28 DIAGNOSIS — G894 Chronic pain syndrome: Secondary | ICD-10-CM | POA: Diagnosis not present

## 2021-08-25 ENCOUNTER — Ambulatory Visit: Payer: Medicare Other | Admitting: Neurology

## 2021-09-03 ENCOUNTER — Emergency Department (HOSPITAL_COMMUNITY): Payer: Medicare Other

## 2021-09-03 ENCOUNTER — Encounter (HOSPITAL_COMMUNITY): Payer: Self-pay | Admitting: Emergency Medicine

## 2021-09-03 ENCOUNTER — Emergency Department (HOSPITAL_COMMUNITY)
Admission: EM | Admit: 2021-09-03 | Discharge: 2021-09-03 | Disposition: A | Discharge status: Home / Self Care | Payer: Medicare Other | Attending: Emergency Medicine | Admitting: Emergency Medicine

## 2021-09-03 ENCOUNTER — Ambulatory Visit: Payer: Medicare Other

## 2021-09-03 DIAGNOSIS — S161XXA Strain of muscle, fascia and tendon at neck level, initial encounter: Secondary | ICD-10-CM | POA: Insufficient documentation

## 2021-09-03 DIAGNOSIS — I10 Essential (primary) hypertension: Secondary | ICD-10-CM | POA: Diagnosis not present

## 2021-09-03 DIAGNOSIS — Y92481 Parking lot as the place of occurrence of the external cause: Secondary | ICD-10-CM | POA: Insufficient documentation

## 2021-09-03 DIAGNOSIS — M5021 Other cervical disc displacement,  high cervical region: Secondary | ICD-10-CM | POA: Diagnosis not present

## 2021-09-03 DIAGNOSIS — Z7951 Long term (current) use of inhaled steroids: Secondary | ICD-10-CM | POA: Insufficient documentation

## 2021-09-03 DIAGNOSIS — M5030 Other cervical disc degeneration, unspecified cervical region: Secondary | ICD-10-CM | POA: Diagnosis not present

## 2021-09-03 DIAGNOSIS — S0990XA Unspecified injury of head, initial encounter: Secondary | ICD-10-CM | POA: Diagnosis not present

## 2021-09-03 DIAGNOSIS — Z85828 Personal history of other malignant neoplasm of skin: Secondary | ICD-10-CM | POA: Insufficient documentation

## 2021-09-03 DIAGNOSIS — I700 Atherosclerosis of aorta: Secondary | ICD-10-CM | POA: Diagnosis not present

## 2021-09-03 DIAGNOSIS — S199XXA Unspecified injury of neck, initial encounter: Secondary | ICD-10-CM | POA: Diagnosis not present

## 2021-09-03 DIAGNOSIS — M50221 Other cervical disc displacement at C4-C5 level: Secondary | ICD-10-CM | POA: Diagnosis not present

## 2021-09-03 NOTE — Discharge Instructions (Signed)
Return for any problem.  ?
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
## 2021-09-15 DIAGNOSIS — Z23 Encounter for immunization: Secondary | ICD-10-CM | POA: Diagnosis not present

## 2021-09-15 DIAGNOSIS — I700 Atherosclerosis of aorta: Secondary | ICD-10-CM | POA: Diagnosis not present

## 2021-09-15 DIAGNOSIS — M542 Cervicalgia: Secondary | ICD-10-CM | POA: Diagnosis not present

## 2021-09-22 ENCOUNTER — Ambulatory Visit: Payer: Medicare Other | Admitting: Neurology

## 2021-09-22 ENCOUNTER — Telehealth: Payer: Self-pay | Admitting: Neurology

## 2021-09-22 NOTE — Telephone Encounter (Signed)
FYI- pt called to reschedule her appt for today, not feeling good this morning.

## 2021-09-24 DIAGNOSIS — G894 Chronic pain syndrome: Secondary | ICD-10-CM | POA: Diagnosis not present

## 2021-09-24 DIAGNOSIS — G4700 Insomnia, unspecified: Secondary | ICD-10-CM | POA: Diagnosis not present

## 2021-09-24 DIAGNOSIS — M47817 Spondylosis without myelopathy or radiculopathy, lumbosacral region: Secondary | ICD-10-CM | POA: Diagnosis not present

## 2021-10-07 ENCOUNTER — Ambulatory Visit
Admission: RE | Admit: 2021-10-07 | Discharge: 2021-10-07 | Disposition: A | Discharge status: Home / Self Care | Payer: Medicare Other | Source: Ambulatory Visit | Attending: Internal Medicine | Admitting: Internal Medicine

## 2021-10-07 ENCOUNTER — Other Ambulatory Visit: Payer: Self-pay

## 2021-10-07 DIAGNOSIS — Z1231 Encounter for screening mammogram for malignant neoplasm of breast: Secondary | ICD-10-CM | POA: Diagnosis not present

## 2021-10-28 ENCOUNTER — Ambulatory Visit: Payer: Medicare Other | Admitting: Neurology

## 2021-11-10 DIAGNOSIS — N182 Chronic kidney disease, stage 2 (mild): Secondary | ICD-10-CM | POA: Diagnosis not present

## 2021-11-10 DIAGNOSIS — I10 Essential (primary) hypertension: Secondary | ICD-10-CM | POA: Diagnosis not present

## 2021-11-10 DIAGNOSIS — M179 Osteoarthritis of knee, unspecified: Secondary | ICD-10-CM | POA: Diagnosis not present

## 2021-11-10 DIAGNOSIS — K219 Gastro-esophageal reflux disease without esophagitis: Secondary | ICD-10-CM | POA: Diagnosis not present

## 2021-11-10 DIAGNOSIS — E7800 Pure hypercholesterolemia, unspecified: Secondary | ICD-10-CM | POA: Diagnosis not present

## 2021-11-10 DIAGNOSIS — M1990 Unspecified osteoarthritis, unspecified site: Secondary | ICD-10-CM | POA: Diagnosis not present

## 2021-11-25 DIAGNOSIS — G4700 Insomnia, unspecified: Secondary | ICD-10-CM | POA: Diagnosis not present

## 2021-11-25 DIAGNOSIS — M47817 Spondylosis without myelopathy or radiculopathy, lumbosacral region: Secondary | ICD-10-CM | POA: Diagnosis not present

## 2021-11-25 DIAGNOSIS — G894 Chronic pain syndrome: Secondary | ICD-10-CM | POA: Diagnosis not present

## 2021-11-26 ENCOUNTER — Telehealth: Payer: Self-pay | Admitting: Neurology

## 2021-11-26 ENCOUNTER — Ambulatory Visit: Payer: Medicare Other | Admitting: Neurology

## 2021-11-26 NOTE — Telephone Encounter (Signed)
FYI- pt called to resched her appt for today, having COVID symptoms.

## 2021-11-27 DIAGNOSIS — U071 COVID-19: Secondary | ICD-10-CM | POA: Diagnosis not present

## 2021-11-27 DIAGNOSIS — R0989 Other specified symptoms and signs involving the circulatory and respiratory systems: Secondary | ICD-10-CM | POA: Diagnosis not present

## 2021-11-28 DIAGNOSIS — G4733 Obstructive sleep apnea (adult) (pediatric): Secondary | ICD-10-CM | POA: Diagnosis not present

## 2021-12-17 ENCOUNTER — Ambulatory Visit: Payer: Medicare Other | Admitting: Neurology

## 2022-01-09 DIAGNOSIS — E7800 Pure hypercholesterolemia, unspecified: Secondary | ICD-10-CM | POA: Diagnosis not present

## 2022-01-09 DIAGNOSIS — M1990 Unspecified osteoarthritis, unspecified site: Secondary | ICD-10-CM | POA: Diagnosis not present

## 2022-01-09 DIAGNOSIS — N182 Chronic kidney disease, stage 2 (mild): Secondary | ICD-10-CM | POA: Diagnosis not present

## 2022-01-09 DIAGNOSIS — M179 Osteoarthritis of knee, unspecified: Secondary | ICD-10-CM | POA: Diagnosis not present

## 2022-01-09 DIAGNOSIS — I10 Essential (primary) hypertension: Secondary | ICD-10-CM | POA: Diagnosis not present

## 2022-01-28 DIAGNOSIS — G894 Chronic pain syndrome: Secondary | ICD-10-CM | POA: Diagnosis not present

## 2022-01-28 DIAGNOSIS — M47817 Spondylosis without myelopathy or radiculopathy, lumbosacral region: Secondary | ICD-10-CM | POA: Diagnosis not present

## 2022-01-28 DIAGNOSIS — G4700 Insomnia, unspecified: Secondary | ICD-10-CM | POA: Diagnosis not present

## 2022-02-05 ENCOUNTER — Ambulatory Visit (INDEPENDENT_AMBULATORY_CARE_PROVIDER_SITE_OTHER): Payer: Medicare Other | Admitting: Neurology

## 2022-02-05 ENCOUNTER — Encounter: Payer: Self-pay | Admitting: Neurology

## 2022-02-05 VITALS — BP 146/92 | HR 75 | Ht 66.0 in | Wt 223.0 lb

## 2022-02-05 DIAGNOSIS — K219 Gastro-esophageal reflux disease without esophagitis: Secondary | ICD-10-CM | POA: Diagnosis not present

## 2022-02-05 DIAGNOSIS — G4733 Obstructive sleep apnea (adult) (pediatric): Secondary | ICD-10-CM | POA: Insufficient documentation

## 2022-02-05 DIAGNOSIS — J309 Allergic rhinitis, unspecified: Secondary | ICD-10-CM | POA: Diagnosis not present

## 2022-02-05 DIAGNOSIS — R799 Abnormal finding of blood chemistry, unspecified: Secondary | ICD-10-CM | POA: Diagnosis not present

## 2022-02-05 DIAGNOSIS — R413 Other amnesia: Secondary | ICD-10-CM | POA: Insufficient documentation

## 2022-02-05 DIAGNOSIS — E7800 Pure hypercholesterolemia, unspecified: Secondary | ICD-10-CM | POA: Insufficient documentation

## 2022-02-05 DIAGNOSIS — I82403 Acute embolism and thrombosis of unspecified deep veins of lower extremity, bilateral: Secondary | ICD-10-CM | POA: Insufficient documentation

## 2022-02-05 DIAGNOSIS — J301 Allergic rhinitis due to pollen: Secondary | ICD-10-CM | POA: Insufficient documentation

## 2022-02-05 DIAGNOSIS — M5136 Other intervertebral disc degeneration, lumbar region: Secondary | ICD-10-CM | POA: Diagnosis not present

## 2022-02-05 DIAGNOSIS — G894 Chronic pain syndrome: Secondary | ICD-10-CM | POA: Diagnosis not present

## 2022-02-05 DIAGNOSIS — N182 Chronic kidney disease, stage 2 (mild): Secondary | ICD-10-CM | POA: Insufficient documentation

## 2022-02-05 DIAGNOSIS — F331 Major depressive disorder, recurrent, moderate: Secondary | ICD-10-CM | POA: Insufficient documentation

## 2022-02-05 DIAGNOSIS — M1990 Unspecified osteoarthritis, unspecified site: Secondary | ICD-10-CM | POA: Insufficient documentation

## 2022-02-05 DIAGNOSIS — M109 Gout, unspecified: Secondary | ICD-10-CM | POA: Insufficient documentation

## 2022-02-05 DIAGNOSIS — Z7901 Long term (current) use of anticoagulants: Secondary | ICD-10-CM | POA: Insufficient documentation

## 2022-02-05 DIAGNOSIS — R7989 Other specified abnormal findings of blood chemistry: Secondary | ICD-10-CM | POA: Diagnosis not present

## 2022-02-05 DIAGNOSIS — I700 Atherosclerosis of aorta: Secondary | ICD-10-CM | POA: Insufficient documentation

## 2022-02-05 DIAGNOSIS — E538 Deficiency of other specified B group vitamins: Secondary | ICD-10-CM | POA: Diagnosis not present

## 2022-02-05 DIAGNOSIS — Z800 Family history of malignant neoplasm of digestive organs: Secondary | ICD-10-CM | POA: Insufficient documentation

## 2022-02-05 DIAGNOSIS — Z85828 Personal history of other malignant neoplasm of skin: Secondary | ICD-10-CM | POA: Insufficient documentation

## 2022-02-05 DIAGNOSIS — I10 Essential (primary) hypertension: Secondary | ICD-10-CM | POA: Diagnosis not present

## 2022-02-05 DIAGNOSIS — R432 Parageusia: Secondary | ICD-10-CM | POA: Insufficient documentation

## 2022-02-05 DIAGNOSIS — F32A Depression, unspecified: Secondary | ICD-10-CM

## 2022-02-05 DIAGNOSIS — F324 Major depressive disorder, single episode, in partial remission: Secondary | ICD-10-CM | POA: Insufficient documentation
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
## 2022-02-05 NOTE — Patient Instructions (Signed)
Continue current medications  Follow up with Dr. Lysle Rubens for referral to therapist for management of depression  Return if worse     There are well-accepted and sensible ways to reduce risk for Alzheimers disease and other degenerative brain disorders .  Exercise Daily Walk A daily 20 minute walk should be part of your routine. Disease related apathy can be a significant roadblock to exercise and the only way to overcome this is to make it a daily routine and perhaps have a reward at the end (something your loved one loves to eat or drink perhaps) or a personal trainer coming to the home can also be very useful. Most importantly, the patient is much more likely to exercise if the caregiver / spouse does it with him/her. In general a structured, repetitive schedule is best.  General Health: Any diseases which effect your body will effect your brain such as a pneumonia, urinary infection, blood clot, heart attack or stroke. Keep contact with your primary care doctor for regular follow ups.  Sleep. A good nights sleep is healthy for the brain. Seven hours is recommended. If you have insomnia or poor sleep habits we can give you some instructions. If you have sleep apnea wear your mask.  Diet: Eating a heart healthy diet is also a good idea; fish and poultry instead of red meat, nuts (mostly non-peanuts), vegetables, fruits, olive oil or canola oil (instead of butter), minimal salt (use other spices to flavor foods), whole grain rice, bread, cereal and pasta and wine in moderation.Research is now showing that the MIND diet, which is a combination of The Mediterranean diet and the DASH diet, is beneficial for cognitive processing and longevity. Information about this diet can be found in The MIND Diet, a book by Doyne Keel, MS, RDN, and online at [employer]book[employer].si  Finances, Power of Attorney and Advance Directives: You should consider putting legal safeguards in place with  regard to financial and medical decision making. While the spouse always has power of attorney for medical and financial issues in the absence of any form, you should consider what you want in case the spouse / caregiver is no longer around or capable of making decisions.     Heart-head connection  New research shows there are things we can do to reduce the risk of mild cognitive impairment and dementia.  Several conditions known to increase the risk of cardiovascular disease -- such as high blood pressure, diabetes and high cholesterol -- also increase the risk of developing Alzheimer's. Some autopsy studies show that as many as 109 percent of individuals with Alzheimer's disease also have cardiovascular disease.  A longstanding question is why some people develop hallmark Alzheimer's plaques and tangles but do not develop the symptoms of Alzheimer's. Vascular disease may help researchers eventually find an answer. Some autopsy studies suggest that plaques and tangles may be present in the brain without causing symptoms of cognitive decline unless the brain also shows evidence of vascular disease. More research is needed to better understand the link between vascular health and Alzheimer's.  Physical exercise and diet Regular physical exercise may be a beneficial strategy to lower the risk of Alzheimer's and vascular dementia. Exercise may directly benefit brain cells by increasing blood and oxygen flow in the brain. Because of its known cardiovascular benefits, a medically approved exercise program is a valuable part of any overall wellness plan.  Current evidence suggests that heart-healthy eating may also help protect the brain. Heart-healthy eating includes limiting the  intake of sugar and saturated fats and making sure to eat plenty of fruits, vegetables, and whole grains. No one diet is best. Two diets that have been studied and may be beneficial are the DASH (Dietary Approaches to Stop  Hypertension) diet and the Mediterranean diet. The DASH diet emphasizes vegetables, fruits and fat-free or low-fat dairy products; includes whole grains, fish, poultry, beans, seeds, nuts and vegetable oils; and limits sodium, sweets, sugary beverages and red meats. A Mediterranean diet includes relatively little red meat and emphasizes whole grains, fruits and vegetables, fish and shellfish, and nuts, olive oil and other healthy fats.  Social connections and intellectual activity A number of studies indicate that maintaining strong social connections and keeping mentally active as we age might lower the risk of cognitive decline and Alzheimer's. Experts are not certain about the reason for this association. It may be due to direct mechanisms through which social and mental stimulation strengthen connections between nerve cells in the brain.  Head trauma There appears to be a strong link between future risk of Alzheimer's and serious head trauma, especially when injury involves loss of consciousness. You can help reduce your risk of Alzheimer's by protecting your head.  Wear a seat belt  Use a helmet when participating in sports  "Fall-proof" your home   What you can do now While research is not yet conclusive, certain lifestyle choices, such as physical activity and diet, may help support brain health and prevent Alzheimer's. Many of these lifestyle changes have been shown to lower the risk of other diseases, like heart disease and diabetes, which have been linked to Alzheimer's. With few drawbacks and plenty of known benefits, healthy lifestyle choices can improve your health and possibly protect your brain.  Learn more about brain health. You can help increase our knowledge by considering participation in a clinical study. Our free clinical trial matching services, TrialMatch, can help you find clinical trials in your area that are seeking volunteers.

## 2022-02-06 LAB — DEMENTIA PANEL
Homocysteine: 10.7 umol/L (ref 0.0–17.2)
RPR Ser Ql: NONREACTIVE
TSH: 3.2 u[IU]/mL (ref 0.450–4.500)
Vitamin B-12: 447 pg/mL (ref 232–1245)

## 2022-02-09 ENCOUNTER — Telehealth: Payer: Self-pay | Admitting: *Deleted

## 2022-02-09 NOTE — Progress Notes (Signed)
Please call and advise the patient that the recent dementia labs we checked were within normal limits. No further action is required on these tests at this time. Please remind patient to keep any upcoming appointments or tests and to call us with any interim questions, concerns, problems or updates. Thanks,   Alric Ran, MD

## 2022-02-09 NOTE — Telephone Encounter (Signed)
I spoke to the patient to inform her of the lab results.

## 2022-02-09 NOTE — Telephone Encounter (Signed)
-----   Message from Alric Ran, MD sent at 02/09/2022  8:24 AM EST ----- Please call and advise the patient that the recent dementia labs we checked were within normal limits. No further action is required on these tests at this time. Please remind patient to keep any upcoming appointments or tests and to call us with any interim questions, concerns, problems or updates. Thanks,   Alric Ran, MD

## 2022-02-10 DIAGNOSIS — G4733 Obstructive sleep apnea (adult) (pediatric): Secondary | ICD-10-CM | POA: Diagnosis not present

## 2022-03-10 DIAGNOSIS — G4733 Obstructive sleep apnea (adult) (pediatric): Secondary | ICD-10-CM | POA: Diagnosis not present

## 2022-04-07 DIAGNOSIS — M47817 Spondylosis without myelopathy or radiculopathy, lumbosacral region: Secondary | ICD-10-CM | POA: Diagnosis not present

## 2022-04-07 DIAGNOSIS — G894 Chronic pain syndrome: Secondary | ICD-10-CM | POA: Diagnosis not present

## 2022-04-07 DIAGNOSIS — G4700 Insomnia, unspecified: Secondary | ICD-10-CM | POA: Diagnosis not present

## 2022-04-10 DIAGNOSIS — G4733 Obstructive sleep apnea (adult) (pediatric): Secondary | ICD-10-CM | POA: Diagnosis not present

## 2022-04-16 ENCOUNTER — Ambulatory Visit: Payer: Medicare Other | Admitting: Allergy & Immunology

## 2022-05-10 DIAGNOSIS — G4733 Obstructive sleep apnea (adult) (pediatric): Secondary | ICD-10-CM | POA: Diagnosis not present

## 2022-05-19 DIAGNOSIS — H2511 Age-related nuclear cataract, right eye: Secondary | ICD-10-CM | POA: Diagnosis not present

## 2022-05-19 DIAGNOSIS — H524 Presbyopia: Secondary | ICD-10-CM | POA: Diagnosis not present

## 2022-05-20 ENCOUNTER — Other Ambulatory Visit: Payer: Self-pay | Admitting: Physical Medicine and Rehabilitation

## 2022-05-20 ENCOUNTER — Ambulatory Visit
Admission: RE | Admit: 2022-05-20 | Discharge: 2022-05-20 | Disposition: A | Discharge status: Home / Self Care | Payer: Medicare Other | Source: Ambulatory Visit | Attending: Physical Medicine and Rehabilitation | Admitting: Physical Medicine and Rehabilitation

## 2022-05-20 DIAGNOSIS — G4700 Insomnia, unspecified: Secondary | ICD-10-CM | POA: Diagnosis not present

## 2022-05-20 DIAGNOSIS — M25571 Pain in right ankle and joints of right foot: Secondary | ICD-10-CM

## 2022-05-20 DIAGNOSIS — M47817 Spondylosis without myelopathy or radiculopathy, lumbosacral region: Secondary | ICD-10-CM | POA: Diagnosis not present

## 2022-05-20 DIAGNOSIS — G894 Chronic pain syndrome: Secondary | ICD-10-CM | POA: Diagnosis not present

## 2022-06-09 ENCOUNTER — Ambulatory Visit: Payer: Medicare Other | Admitting: Allergy & Immunology

## 2022-06-10 DIAGNOSIS — Z03818 Encounter for observation for suspected exposure to other biological agents ruled out: Secondary | ICD-10-CM | POA: Diagnosis not present

## 2022-06-10 DIAGNOSIS — J029 Acute pharyngitis, unspecified: Secondary | ICD-10-CM | POA: Diagnosis not present

## 2022-06-10 DIAGNOSIS — G4733 Obstructive sleep apnea (adult) (pediatric): Secondary | ICD-10-CM | POA: Diagnosis not present

## 2022-06-11 DIAGNOSIS — J209 Acute bronchitis, unspecified: Secondary | ICD-10-CM | POA: Diagnosis not present

## 2022-06-11 DIAGNOSIS — J029 Acute pharyngitis, unspecified: Secondary | ICD-10-CM | POA: Diagnosis not present

## 2022-06-17 ENCOUNTER — Other Ambulatory Visit: Payer: Self-pay | Admitting: Internal Medicine

## 2022-06-17 DIAGNOSIS — Z1231 Encounter for screening mammogram for malignant neoplasm of breast: Secondary | ICD-10-CM

## 2022-06-26 ENCOUNTER — Emergency Department (HOSPITAL_COMMUNITY)
Admission: EM | Admit: 2022-06-26 | Discharge: 2022-06-26 | Disposition: A | Discharge status: Home / Self Care | Payer: Medicare Other | Attending: Emergency Medicine | Admitting: Emergency Medicine

## 2022-06-26 ENCOUNTER — Encounter (HOSPITAL_COMMUNITY): Payer: Self-pay

## 2022-06-26 ENCOUNTER — Other Ambulatory Visit: Payer: Self-pay

## 2022-06-26 ENCOUNTER — Emergency Department (HOSPITAL_COMMUNITY): Payer: Medicare Other

## 2022-06-26 DIAGNOSIS — M7989 Other specified soft tissue disorders: Secondary | ICD-10-CM | POA: Diagnosis not present

## 2022-06-26 DIAGNOSIS — X58XXXA Exposure to other specified factors, initial encounter: Secondary | ICD-10-CM | POA: Diagnosis not present

## 2022-06-26 DIAGNOSIS — S93401A Sprain of unspecified ligament of right ankle, initial encounter: Secondary | ICD-10-CM

## 2022-06-26 DIAGNOSIS — M19071 Primary osteoarthritis, right ankle and foot: Secondary | ICD-10-CM | POA: Diagnosis not present

## 2022-06-26 DIAGNOSIS — S99911A Unspecified injury of right ankle, initial encounter: Secondary | ICD-10-CM | POA: Diagnosis present

## 2022-06-26 DIAGNOSIS — M7731 Calcaneal spur, right foot: Secondary | ICD-10-CM | POA: Diagnosis not present

## 2022-06-26 MED ORDER — ACETAMINOPHEN 325 MG PO TABS: 650.0000 mg | ORAL_TABLET | Freq: Once | ORAL | Status: DC

## 2022-06-26 NOTE — ED Triage Notes (Signed)
Patient c/o right ankle swelling and pain since 0300 today. Patient denies any injury.

## 2022-06-26 NOTE — ED Provider Triage Note (Signed)
Emergency Medicine Provider Triage Evaluation Note  Helen Hess , a 66 y.o. female  was evaluated in triage.  Pt complains of acute onset right ankle pain with some swelling. Patient reports she's had similar pain before but isn't sure what she was diagnosed with. Pain just began 3a. Denies numbness, tingling. Has not tried anything for pain today, reports tried hydrocodone last night with no relief. No recent injury.  Review of Systems  Positive: Ankle pain Negative: Numbness, tingling  Physical Exam  BP (!) 139/96 (BP Location: Left Arm)   Pulse 87   Temp 99 F (37.2 C) (Oral)   Resp 18   Ht '5\' 6"'$  (1.676 m)   Wt 102.1 kg   SpO2 97%   BMI 36.32 kg/m  Gen:   Awake, no distress   Resp:  Normal effort  MSK:   Moves extremities without difficulty  Other:  Ttp of right ankle without significant stepoff, deformity or notable swelling. DP, PT pulse intact, 2+  Medical Decision Making  Medically screening exam initiated at 5:52 PM.  Appropriate orders placed.  Garrie Leni Pankonin was informed that the remainder of the evaluation will be completed by another provider, this initial triage assessment does not replace that evaluation, and the importance of remaining in the ED until their evaluation is complete.  Workup initiated   Anselmo Pickler, [place] 06/26/22 1754

## 2022-06-26 NOTE — ED Provider Notes (Signed)
[facility] DEPT Provider Note   CSN: [id number] Arrival date & time: 06/26/22  1703     History  Chief Complaint  Patient presents with   Joint Swelling   Ankle Pain    Helen Hess is a 66 y.o. female who presents to the emergency department with concerns for right ankle pain and swelling onset 3 AM.  Denies recent injury, trauma, fall.  Notes that her pain is similar to when she was evaluated with an x-ray in June 2023.  Patient notes during exam that she was not made aware of what was going on with her foot and that is what prompted her visit today.  Has tried her prescription hydrocodone without relief.  Denies fever, color change.  Patient notes that she has been ambulating with a cane today secondary to pain.   The history is provided by the patient. No language interpreter was used.       Home Medications Prior to Admission medications   Medication Sig Start Date End Date Taking? Authorizing Provider  amLODipine (NORVASC) 5 MG tablet Take 5 mg by mouth daily.  02/21/15   [provider]  buPROPion (WELLBUTRIN XL) 300 MG 24 hr tablet Take 1 tablet (300 mg total) by mouth every morning. 07/05/15 03/04/17  Arfeen, Arlyce Harman, MD  citalopram (CELEXA) 20 MG tablet Take 1 tablet (20 mg total) by mouth daily. 07/05/15   Arfeen, Arlyce Harman, MD  cyclobenzaprine (FLEXERIL) 10 MG tablet Take 10 mg by mouth 3 (three) times daily as needed for muscle spasms.  02/21/15   [provider]  fluocinonide cream (LIDEX) 0.86 % Apply 1 application topically 2 (two) times daily as needed (skin irritation).  02/15/17   [provider]  HYDROcodone-acetaminophen (NORCO/VICODIN) 5-325 MG per tablet Take 1 tablet by mouth every 6 (six) hours as needed for moderate pain.     [provider]  methocarbamol (ROBAXIN) 500 MG tablet Take 1 tablet (500 mg total) by mouth 2 (two) times daily. 07/10/16   Hyman Bible, PA-C  pantoprazole (PROTONIX) 40  MG tablet Take 1 tablet by mouth daily. 06/06/15   [provider]  PRESCRIPTION MEDICATION Pt has CPAP machine    [provider]  rivaroxaban (XARELTO) 20 MG TABS tablet Take 20 mg by mouth daily with supper.    [provider]  simvastatin (ZOCOR) 20 MG tablet Take 20 mg by mouth every evening.  02/21/15   [provider]  valsartan-hydrochlorothiazide (DIOVAN-HCT) 160-12.5 MG per tablet Take 1 tablet by mouth daily.     [provider]      Allergies    Amlodipine besylate, Atorvastatin, Dust mite extract, Other, Rosuvastatin, and Tramadol    Review of Systems   Review of Systems  Constitutional:  Negative for fever.  Musculoskeletal:  Positive for arthralgias and joint swelling. Negative for gait problem.  Skin:  Negative for color change and wound.  All other systems reviewed and are negative.   Physical Exam Updated Vital Signs BP (!) 139/96 (BP Location: Left Arm)   Pulse 87   Temp 99 F (37.2 C) (Oral)   Resp 18   Ht '5\' 6"'$  (1.676 m)   Wt 102.1 kg   SpO2 97%   BMI 36.32 kg/m  Physical Exam Vitals and nursing note reviewed.  Constitutional:      General: She is not in acute distress.    Appearance: Normal appearance.  Eyes:     General: No scleral  icterus.    Extraocular Movements: Extraocular movements intact.  Cardiovascular:     Rate and Rhythm: Normal rate.  Pulmonary:     Effort: Pulmonary effort is normal. No respiratory distress.  Musculoskeletal:     Cervical back: Neck supple.     Comments: TTP noted to medial aspect of right ankle. Mild swelling noted to the area. No overlying skin changes or obvious deformities. Pedal pulses intact. Able to flex and extend ankle against resistance.   Skin:    General: Skin is warm and dry.     Findings: No bruising, erythema or rash.  Neurological:     Mental Status: She is alert.  Psychiatric:        Behavior: Behavior normal.     ED Results / Procedures / Treatments    Labs (all labs ordered are listed, but only abnormal results are displayed) Labs Reviewed - No data to display  EKG None  Radiology DG Ankle Complete Right  Result Date: 06/26/2022 CLINICAL DATA:  Right ankle swelling and pain EXAM: RIGHT ANKLE - COMPLETE 3+ VIEW COMPARISON:  05/20/2022 FINDINGS: Frontal, oblique, and lateral views of the right ankle are obtained. No acute fracture, subluxation, or dislocation. Stable osteoarthritis. Small inferior calcaneal spur again noted. Soft tissues are unremarkable. IMPRESSION: 1. Stable degenerative change.  No acute bony abnormality. Electronically Signed   By: Randa Ngo M.D.   On: 06/26/2022 18:24    Procedures Procedures    Medications Ordered in ED Medications - No data to display  ED Course/ Medical Decision Making/ A&P Clinical Course as of 06/26/22 1859  Fri Jun 26, 2022  1810 Offered patient Tylenol and ice prior to x-ray, patient declines at this time. [SB]  1856 Discussed with patient x-ray findings and discharge treatment plan.  Offered patient Tylenol and ice once again, patient declined at this time. [SB]  1858 Notification from RN that patient is requesting anti-inflammatories prior to discharge.  Patient is currently taking Xarelto.  Discussed with RN that patient can receive Tylenol and Tylenol ordered.  Patient declined Tylenol at this time. [SB]    Clinical Course User Index [SB] Shaquanta Harkless A, PA-C                           Medical Decision Making  Patient with right ankle pain onset 3 AM.  Patient denies injury or trauma.  Has tried 2 of her hydrocodone without relief of her symptoms.  Patient afebrile. On exam, patient with TTP noted to medial aspect of right ankle. Mild swelling noted to the area. No overlying skin changes or obvious deformities. Pedal pulses intact. Able to flex and extend ankle against resistance. Differential diagnosis includes sprain, fracture, dislocation, septic arthritis, osteoarthritis.     Additional history obtained:  External records from outside source obtained and reviewed including: Patient had an x-ray on May 20, 2022 of the right ankle due to chronic medial right ankle pain without injury.  Findings were notable for a small plantar calcaneal heel spur and mild talonavicular osteoarthritis.  No acute fracture or dislocation.  Imaging: I ordered imaging studies including right ankle x-ray I independently visualized and interpreted imaging which showed: Stable degenerative change.  No acute bony findings. I agree with the radiologist interpretation  Disposition: Presentation suspicious for sprain of right ankle.Xray {negative for acute fractures or dislocations.  Doubt septic arthritis at this time, no increased warmth or erythema noted to joint.  Patient able to  range ankle without difficulty. After consideration of the diagnostic results and the patients response to treatment, I feel that the patient would benefit from Discharge home.  Provided with ankle brace today.  Also provided with information for on-call sports medicine group.  Supportive care measures and strict return precautions discussed with patient at bedside.  Patient knowledges and verbalized understanding.  Patient appears safe for discharge.  Follow-up as indicated in discharge paperwork.   This chart was dictated using voice recognition software, Dragon. Despite the best efforts of this provider to proofread and correct errors, errors may still occur which can change documentation meaning.   Final Clinical Impression(s) / ED Diagnoses Final diagnoses:  Sprain of right ankle, unspecified ligament, initial encounter    Rx / DC Orders ED Discharge Orders     None         Jennae Hakeem A, PA-C 06/26/22 1903    Drenda Freeze, MD 06/26/22 [phone]

## 2022-06-26 NOTE — Discharge Instructions (Signed)
It was a pleasure taking care of you!   Your x-ray was negative for fracture or dislocation.  You may take over the counter 500 mg Tylenol every 6 hours as needed for pain for no more than 7 days.  You will be giving a ankle brace today, wear during the day you may remove it at night.  Apply ice to the affected area for 15 minutes at a time, ensure to place a barrier between your skin and the ice.  You may follow-up with your primary care provider as needed.  Attached is information for the on-call sports medicine group, call on Monday, 06/29/2022 to set up a follow-up appointment regarding today's ED visit.  Return to the ED if you are experiencing increasing/worsening difficulty walking, redness, difficulty moving the ankle, worsening symptoms.

## 2022-06-26 NOTE — Progress Notes (Signed)
Orthopedic Tech Progress Note Patient Details:  Helen Hess [birth year] [id number]  Ortho Devices Type of Ortho Device: ASO, Crutches Ortho Device/Splint Location: Right ankle Ortho Device/Splint Interventions: Application   Post Interventions Patient Tolerated: Well  [name] [name] 06/26/2022, 7:22 PM

## 2022-07-01 DIAGNOSIS — M25571 Pain in right ankle and joints of right foot: Secondary | ICD-10-CM | POA: Diagnosis not present

## 2022-07-02 ENCOUNTER — Other Ambulatory Visit: Payer: Self-pay | Admitting: Internal Medicine

## 2022-07-02 DIAGNOSIS — N644 Mastodynia: Secondary | ICD-10-CM | POA: Diagnosis not present

## 2022-07-02 DIAGNOSIS — M25571 Pain in right ankle and joints of right foot: Secondary | ICD-10-CM | POA: Diagnosis not present

## 2022-07-14 DIAGNOSIS — G4733 Obstructive sleep apnea (adult) (pediatric): Secondary | ICD-10-CM | POA: Diagnosis not present

## 2022-07-23 ENCOUNTER — Ambulatory Visit: Payer: Medicare Other | Admitting: Allergy & Immunology

## 2022-07-24 DIAGNOSIS — G894 Chronic pain syndrome: Secondary | ICD-10-CM | POA: Diagnosis not present

## 2022-07-24 DIAGNOSIS — M47817 Spondylosis without myelopathy or radiculopathy, lumbosacral region: Secondary | ICD-10-CM | POA: Diagnosis not present

## 2022-07-24 DIAGNOSIS — G4700 Insomnia, unspecified: Secondary | ICD-10-CM | POA: Diagnosis not present

## 2022-07-27 ENCOUNTER — Other Ambulatory Visit: Payer: Medicare Other

## 2022-07-30 DIAGNOSIS — I700 Atherosclerosis of aorta: Secondary | ICD-10-CM | POA: Diagnosis not present

## 2022-07-30 DIAGNOSIS — Z85828 Personal history of other malignant neoplasm of skin: Secondary | ICD-10-CM | POA: Diagnosis not present

## 2022-07-30 DIAGNOSIS — I82403 Acute embolism and thrombosis of unspecified deep veins of lower extremity, bilateral: Secondary | ICD-10-CM | POA: Diagnosis not present

## 2022-07-30 DIAGNOSIS — I10 Essential (primary) hypertension: Secondary | ICD-10-CM | POA: Diagnosis not present

## 2022-07-30 DIAGNOSIS — E7800 Pure hypercholesterolemia, unspecified: Secondary | ICD-10-CM | POA: Diagnosis not present

## 2022-07-30 DIAGNOSIS — Z0000 Encounter for general adult medical examination without abnormal findings: Secondary | ICD-10-CM | POA: Diagnosis not present

## 2022-07-30 DIAGNOSIS — M5136 Other intervertebral disc degeneration, lumbar region: Secondary | ICD-10-CM | POA: Diagnosis not present

## 2022-07-30 DIAGNOSIS — G894 Chronic pain syndrome: Secondary | ICD-10-CM | POA: Diagnosis not present

## 2022-07-30 DIAGNOSIS — G4733 Obstructive sleep apnea (adult) (pediatric): Secondary | ICD-10-CM | POA: Diagnosis not present

## 2022-07-30 DIAGNOSIS — N182 Chronic kidney disease, stage 2 (mild): Secondary | ICD-10-CM | POA: Diagnosis not present

## 2022-07-31 DIAGNOSIS — G4733 Obstructive sleep apnea (adult) (pediatric): Secondary | ICD-10-CM | POA: Diagnosis not present

## 2022-08-19 ENCOUNTER — Other Ambulatory Visit: Payer: Self-pay | Admitting: Internal Medicine

## 2022-08-19 ENCOUNTER — Ambulatory Visit: Payer: Medicare Other

## 2022-08-19 ENCOUNTER — Ambulatory Visit
Admission: RE | Admit: 2022-08-19 | Discharge: 2022-08-19 | Disposition: A | Discharge status: Home / Self Care | Payer: Medicare Other | Source: Ambulatory Visit | Attending: Internal Medicine | Admitting: Internal Medicine

## 2022-08-19 DIAGNOSIS — N644 Mastodynia: Secondary | ICD-10-CM

## 2022-09-10 DIAGNOSIS — G4733 Obstructive sleep apnea (adult) (pediatric): Secondary | ICD-10-CM | POA: Diagnosis not present

## 2022-09-16 DIAGNOSIS — M47817 Spondylosis without myelopathy or radiculopathy, lumbosacral region: Secondary | ICD-10-CM | POA: Diagnosis not present

## 2022-09-16 DIAGNOSIS — G894 Chronic pain syndrome: Secondary | ICD-10-CM | POA: Diagnosis not present

## 2022-09-16 DIAGNOSIS — G4700 Insomnia, unspecified: Secondary | ICD-10-CM | POA: Diagnosis not present

## 2022-09-17 ENCOUNTER — Ambulatory Visit: Payer: Medicare Other | Admitting: Allergy & Immunology

## 2022-10-16 ENCOUNTER — Ambulatory Visit: Payer: Medicare Other | Admitting: Internal Medicine

## 2022-10-23 ENCOUNTER — Ambulatory Visit: Payer: Medicare Other | Admitting: Internal Medicine

## 2022-10-26 ENCOUNTER — Ambulatory Visit: Payer: Medicare Other | Admitting: Family Medicine

## 2022-11-12 DIAGNOSIS — G4733 Obstructive sleep apnea (adult) (pediatric): Secondary | ICD-10-CM | POA: Diagnosis not present

## 2022-11-19 ENCOUNTER — Ambulatory Visit: Payer: Medicare Other | Admitting: Allergy & Immunology

## 2022-11-19 DIAGNOSIS — Z79891 Long term (current) use of opiate analgesic: Secondary | ICD-10-CM | POA: Diagnosis not present

## 2022-11-19 DIAGNOSIS — M47817 Spondylosis without myelopathy or radiculopathy, lumbosacral region: Secondary | ICD-10-CM | POA: Diagnosis not present

## 2022-11-19 DIAGNOSIS — G894 Chronic pain syndrome: Secondary | ICD-10-CM | POA: Diagnosis not present

## 2022-11-19 DIAGNOSIS — G4700 Insomnia, unspecified: Secondary | ICD-10-CM | POA: Diagnosis not present

## 2022-11-24 ENCOUNTER — Ambulatory Visit (INDEPENDENT_AMBULATORY_CARE_PROVIDER_SITE_OTHER): Payer: 59 | Admitting: Allergy & Immunology

## 2022-11-24 ENCOUNTER — Encounter: Payer: Self-pay | Admitting: Allergy & Immunology

## 2022-11-24 ENCOUNTER — Other Ambulatory Visit: Payer: Self-pay

## 2022-11-24 VITALS — BP 132/88 | HR 82 | Temp 98.1°F | Resp 18 | Ht 66.0 in | Wt 212.8 lb

## 2022-11-24 DIAGNOSIS — J3089 Other allergic rhinitis: Secondary | ICD-10-CM

## 2022-11-24 DIAGNOSIS — L2089 Other atopic dermatitis: Secondary | ICD-10-CM | POA: Diagnosis not present

## 2022-11-24 DIAGNOSIS — J302 Other seasonal allergic rhinitis: Secondary | ICD-10-CM

## 2022-11-24 MED ORDER — BETAMETHASONE DIPROPIONATE 0.05 % EX CREA: TOPICAL_CREAM | Freq: Two times a day (BID) | CUTANEOUS | 3 refills | Status: DC

## 2022-11-24 NOTE — Patient Instructions (Addendum)
1. Seasonal and perennial allergic rhinitis - Testing today showed: grasses, ragweed, weeds, indoor molds, outdoor molds, dust mites, and cockroach. - Copy of test results provided.  - Avoidance measures provided. - Continue with: Zyrtec (cetirizine) '10mg'$  tablet once daily - Start taking: Singulair (montelukast) '10mg'$  daily and Dymista (fluticasone/azelastine) two sprays per nostril 1-2 times daily as needed - You can use an extra dose of the antihistamine, if needed, for breakthrough symptoms.  - Consider nasal saline rinses 1-2 times daily to remove allergens from the nasal cavities as well as help with mucous clearance (this is especially helpful to do before the nasal sprays are given) - Consider allergy shots as a means of long-term control. - Allergy shots "re-train" and "reset" the immune system to ignore environmental allergens and decrease the resulting immune response to those allergens (sneezing, itchy watery eyes, runny nose, nasal congestion, etc).    - Allergy shots improve symptoms in 75-85% of patients.  - We can discuss more at the next appointment if the medications are not working for you.  2. Flexural atopic dermatitis - Skin looks fairly good. - We are adding on a topical steroid to see if this helps with the worse areas.   3. Return in about 6 weeks (around 01/05/2023).    Please inform us of any Emergency Department visits, hospitalizations, or changes in symptoms. Call us before going to the ED for breathing or allergy symptoms since we might be able to fit you in for a sick visit. Feel free to contact us anytime with any questions, problems, or concerns.  It was a pleasure to meet you today!  Websites that have reliable patient information: 1. American Academy of Asthma, Allergy, and Immunology: www.aaaai.org 2. Food Allergy Research and Education (FARE): foodallergy.org 3. Mothers of Asthmatics: http://www.asthmacommunitynetwork.org 4. American College of Allergy,  Asthma, and Immunology: www.acaai.org   COVID-19 Vaccine Information can be found at: [email or web address] For questions related to vaccine distribution or appointments, please email [email or web address] or call [phone].   We realize that you might be concerned about having an allergic reaction to the COVID19 vaccines. To help with that concern, WE ARE OFFERING THE COVID19 VACCINES IN OUR OFFICE! Ask the front desk for dates!     "Like" [place] on Facebook and Instagram for our latest updates!      A healthy democracy works best when [employer] participate! Make sure you are registered to vote! If you have moved or changed any of your contact information, you will need to get this updated before voting!  In some cases, you MAY be able to register to vote online: [email or web address]       Airborne Adult Perc - 11/24/22 1510     Time Antigen Placed [place] Lavella Hammock    Location Back    Number of Test 59    Panel 1 Select    1. Control-Buffer 50% Glycerol Negative    2. Control-Histamine 1 mg/ml 2+    3. Albumin saline Negative    4. Mathews Negative    5. [place] Negative    6. Johnson Negative    7. Sandoval Blue Negative    8. Meadow Fescue Negative    9. Perennial Rye Negative    10. Sweet Vernal Negative    11. Timothy Negative    12. Cocklebur Negative    13. Burweed Marshelder Negative    14. Ragweed, short Negative    15. Ragweed, Giant Negative    16.  Plantain,  English Negative    17. Lamb's Quarters Negative    18. Sheep Sorrell Negative    19. Rough Pigweed Negative    20. Marsh Elder, Rough Negative    21. Mugwort, Common Negative    22. Ash mix Negative    23. Birch mix Negative    24. Beech American Negative    25. Box, Elder Negative    26. Cedar, red Negative    27. Cottonwood, [place] Negative    28. Elm mix Negative    29. Hickory Negative     30. Maple mix Negative    31. Oak, [place] mix Negative    32. Pecan Pollen Negative    33. Pine mix Negative    34. Sycamore Eastern Negative    35. [place], Black Pollen Negative    36. Alternaria alternata Negative    37. Cladosporium Herbarum Negative    38. Aspergillus mix Negative    39. Penicillium mix Negative    40. Bipolaris sorokiniana (Helminthosporium) Negative    41. Drechslera spicifera (Curvularia) Negative    42. Mucor plumbeus Negative    43. Fusarium moniliforme Negative    44. Aureobasidium pullulans (pullulara) Negative    45. Rhizopus oryzae Negative    46. Botrytis cinera Negative    47. Epicoccum nigrum Negative    48. Phoma betae Negative    49. Candida Albicans Negative    50. Trichophyton mentagrophytes Negative    51. Mite, D Farinae  5,000 AU/ml Negative    52. Mite, D Pteronyssinus  5,000 AU/ml Negative    53. Cat Hair 10,000 BAU/ml Negative    54.  Dog Epithelia Negative    55. Mixed Feathers Negative    56. Horse Epithelia Negative    57. Cockroach, German Negative    58. Mouse Negative    59. Tobacco Leaf Negative             Intradermal - 11/24/22 1548     Time Antigen Placed 1548    Allergen Manufacturer Lavella Hammock    Location Arm    Number of Test 15    Intradermal Select    Control Negative    [place] Negative    Johnson 2+    7 Grass 2+    Ragweed mix 3+    Weed mix 1+    Tree mix Negative    Mold 1 2+    Mold 2 2+    Mold 3 Negative    Mold 4 Negative    Cat Negative    Dog Negative    Cockroach 2+    Mite mix 1+             Reducing Pollen Exposure  The American Academy of Allergy, Asthma and Immunology suggests the following steps to reduce your exposure to pollen during allergy seasons.    Do not hang sheets or clothing out to dry; pollen may collect on these items. Do not mow lawns or spend time around freshly cut grass; mowing stirs up pollen. Keep windows closed at night.  Keep car windows closed while  driving. Minimize morning activities outdoors, a time when pollen counts are usually at their highest. Stay indoors as much as possible when pollen counts or humidity is high and on windy days when pollen tends to remain in the air longer. Use air conditioning when possible.  Many air conditioners have filters that trap the pollen spores. Use a HEPA room air filter to remove pollen  form the indoor air you breathe.  Control of Mold Allergen   Mold and fungi can grow on a variety of surfaces provided certain temperature and moisture conditions exist.  Outdoor molds grow on plants, decaying vegetation and soil.  The major outdoor mold, Alternaria and Cladosporium, are found in very high numbers during hot and dry conditions.  Generally, a late Summer - Fall peak is seen for common outdoor fungal spores.  Rain will temporarily lower outdoor mold spore count, but counts rise rapidly when the rainy period ends.  The most important indoor molds are Aspergillus and Penicillium.  Dark, humid and poorly ventilated basements are ideal sites for mold growth.  The next most common sites of mold growth are the bathroom and the kitchen.  Outdoor (Seasonal) Mold Control  Positive outdoor molds via skin testing: Alternaria and Cladosporium  Use air conditioning and keep windows closed Avoid exposure to decaying vegetation. Avoid leaf raking. Avoid grain handling. Consider wearing a face mask if working in moldy areas.    Indoor (Perennial) Mold Control   Positive indoor molds via skin testing: Aspergillus and Penicillium  Maintain humidity below 50%. Clean washable surfaces with 5% bleach solution. Remove sources e.g. contaminated carpets.    Control of Cockroach Allergen  Cockroach allergen has been identified as an important cause of acute attacks of asthma, especially in urban settings.  There are fifty-five species of cockroach that exist in the [place], however only three, the [place],  [place] species produce allergen that can affect patients with Asthma.  Allergens can be obtained from fecal particles, egg casings and secretions from cockroaches.    Remove food sources. Reduce access to water. Seal access and entry points. Spray runways with 0.5-1% Diazinon or Chlorpyrifos Blow boric acid power under stoves and refrigerator. Place bait stations (hydramethylnon) at feeding sites.  Control of Dust Mite Allergen    Dust mites play a major role in allergic asthma and rhinitis.  They occur in environments with high humidity wherever human skin is found.  Dust mites absorb humidity from the atmosphere (ie, they do not drink) and feed on organic matter (including shed human and animal skin).  Dust mites are a microscopic type of insect that you cannot see with the naked eye.  High levels of dust mites have been detected from mattresses, pillows, carpets, upholstered furniture, bed covers, clothes, soft toys and any woven material.  The principal allergen of the dust mite is found in its feces.  A gram of dust may contain 1,000 mites and 250,000 fecal particles.  Mite antigen is easily measured in the air during house cleaning activities.  Dust mites do not bite and do not cause harm to humans, other than by triggering allergies/asthma.    Ways to decrease your exposure to dust mites in your home:  Encase mattresses, box springs and pillows with a mite-impermeable barrier or cover   Wash sheets, blankets and drapes weekly in hot water (130 F) with detergent and dry them in a dryer on the hot setting.  Have the room cleaned frequently with a vacuum cleaner and a damp dust-mop.  For carpeting or rugs, vacuuming with a vacuum cleaner equipped with a high-efficiency particulate air (HEPA) filter.  The dust mite allergic individual should not be in a room which is being cleaned and should wait 1 hour after cleaning before going into the room. Do not sleep on upholstered  furniture (eg, couches).   If possible removing carpeting, upholstered  furniture and drapery from the home is ideal.  Horizontal blinds should be eliminated in the rooms where the person spends the most time (bedroom, study, television room).  Washable vinyl, roller-type shades are optimal. Remove all non-washable stuffed toys from the bedroom.  Wash stuffed toys weekly like sheets and blankets above.   Reduce indoor humidity to less than 50%.  Inexpensive humidity monitors can be purchased at most hardware stores.  Do not use a humidifier as can make the problem worse and are not recommended.   Allergy Shots   Allergies are the result of a chain reaction that starts in the immune system. Your immune system controls how your body defends itself. For instance, if you have an allergy to pollen, your immune system identifies pollen as an invader or allergen. Your immune system overreacts by producing antibodies called Immunoglobulin E (IgE). These antibodies travel to cells that release chemicals, causing an allergic reaction.  The concept behind allergy immunotherapy, whether it is received in the form of shots or tablets, is that the immune system can be desensitized to specific allergens that trigger allergy symptoms. Although it requires time and patience, the payback can be long-term relief.  How Do Allergy Shots Work?  Allergy shots work much like a vaccine. Your body responds to injected amounts of a particular allergen given in increasing doses, eventually developing a resistance and tolerance to it. Allergy shots can lead to decreased, minimal or no allergy symptoms.  There generally are two phases: build-up and maintenance. Build-up often ranges from three to six months and involves receiving injections with increasing amounts of the allergens. The shots are typically given once or twice a week, though more rapid build-up schedules are sometimes used.  The maintenance phase begins when the  most effective dose is reached. This dose is different for each person, depending on how allergic you are and your response to the build-up injections. Once the maintenance dose is reached, there are longer periods between injections, typically two to four weeks.  Occasionally doctors give cortisone-type shots that can temporarily reduce allergy symptoms. These types of shots are different and should not be confused with allergy immunotherapy shots.  Who Can Be Treated with Allergy Shots?  Allergy shots may be a good treatment approach for people with allergic rhinitis (hay fever), allergic asthma, conjunctivitis (eye allergy) or stinging insect allergy.   Before deciding to begin allergy shots, you should consider:   The length of allergy season and the severity of your symptoms  Whether medications and/or changes to your environment can control your symptoms  Your desire to avoid long-term medication use  Time: allergy immunotherapy requires a major time commitment  Cost: may vary depending on your insurance coverage  Allergy shots for children age 29 and older are effective and often well tolerated. They might prevent the onset of new allergen sensitivities or the progression to asthma.  Allergy shots are not started on patients who are pregnant but can be continued on patients who become pregnant while receiving them. In some patients with other medical conditions or who take certain common medications, allergy shots may be of risk. It is important to mention other medications you talk to your allergist.   When Will I Feel Better?  Some may experience decreased allergy symptoms during the build-up phase. For others, it may take as long as 12 months on the maintenance dose. If there is no improvement after a year of maintenance, your allergist will discuss other treatment options  with you.  If you aren't responding to allergy shots, it may be because there is not enough dose of the  allergen in your vaccine or there are missing allergens that were not identified during your allergy testing. Other reasons could be that there are high levels of the allergen in your environment or major exposure to non-allergic triggers like tobacco smoke.  What Is the Length of Treatment?  Once the maintenance dose is reached, allergy shots are generally continued for three to five years. The decision to stop should be discussed with your allergist at that time. Some people may experience a permanent reduction of allergy symptoms. Others may relapse and a longer course of allergy shots can be considered.  What Are the Possible Reactions?  The two types of adverse reactions that can occur with allergy shots are local and systemic. Common local reactions include very mild redness and swelling at the injection site, which can happen immediately or several hours after. A systemic reaction, which is less common, affects the entire body or a particular body system. They are usually mild and typically respond quickly to medications. Signs include increased allergy symptoms such as sneezing, a stuffy nose or hives.  Rarely, a serious systemic reaction called anaphylaxis can develop. Symptoms include swelling in the throat, wheezing, a feeling of tightness in the chest, nausea or dizziness. Most serious systemic reactions develop within 30 minutes of allergy shots. This is why it is strongly recommended you wait in your doctor's office for 30 minutes after your injections. Your allergist is trained to watch for reactions, and his or her staff is trained and equipped with the proper medications to identify and treat them.  Who Should Administer Allergy Shots?  The preferred location for receiving shots is your prescribing allergist's office. Injections can sometimes be given at another facility where the physician and staff are trained to recognize and treat reactions, and have received instructions by your  prescribing allergist.

## 2022-11-24 NOTE — Progress Notes (Unsigned)
NEW PATIENT  Date of Service/Encounter:  11/24/22  Consult requested by: Wenda Low, MD   Assessment:   Seasonal and perennial allergic rhinitis  Flexural atopic dermatitis  Plan/Recommendations:   1. Seasonal and perennial allergic rhinitis - Testing today showed: grasses, ragweed, weeds, indoor molds, outdoor molds, dust mites, and cockroach. - Copy of test results provided.  - Avoidance measures provided. - Continue with: Zyrtec (cetirizine) '10mg'$  tablet once daily - Start taking: Singulair (montelukast) '10mg'$  daily and Dymista (fluticasone/azelastine) two sprays per nostril 1-2 times daily as needed - You can use an extra dose of the antihistamine, if needed, for breakthrough symptoms.  - Consider nasal saline rinses 1-2 times daily to remove allergens from the nasal cavities as well as help with mucous clearance (this is especially helpful to do before the nasal sprays are given) - Consider allergy shots as a means of long-term control. - Allergy shots "re-train" and "reset" the immune system to ignore environmental allergens and decrease the resulting immune response to those allergens (sneezing, itchy watery eyes, runny nose, nasal congestion, etc).    - Allergy shots improve symptoms in 75-85% of patients.  - We can discuss more at the next appointment if the medications are not working for you.  2. Flexural atopic dermatitis - Skin looks fairly good. - We are adding on a topical steroid to see if this helps with the worse areas.   3. Return in about 6 weeks (around 01/05/2023).   This note in its entirety was forwarded to the Provider who requested this consultation.  Subjective:   Tiffannie Sloss is a 66 y.o. female presenting today for evaluation of  Chief Complaint  Patient presents with   Allergy Testing   Cough   Nasal Congestion    Carlia Shaleta Ruacho has a history of the following: Patient Active Problem List   Diagnosis Date Noted   Allergic  rhinitis 02/05/2022   Allergic rhinitis due to pollen 02/05/2022   Aortic arch atherosclerosis ([place]) 02/05/2022   Chronic kidney disease, stage 2 (mild) 02/05/2022   Chronic pain syndrome 02/05/2022   Degeneration of lumbar intervertebral disc 02/05/2022   Deep vein thrombosis (DVT) of both lower extremities ([place]) 02/05/2022   Family history of malignant neoplasm of digestive organs 02/05/2022   Gastroesophageal reflux disease 02/05/2022   Gout 02/05/2022   History of squamous cell carcinoma of skin 02/05/2022   Long term (current) use of anticoagulants 02/05/2022   Major depression in partial remission ([place]) 02/05/2022   Memory deficit 02/05/2022   Moderate recurrent major depression ([place]) 02/05/2022   Obstructive sleep apnea syndrome 02/05/2022   Osteoarthritis 02/05/2022   Parageusia 02/05/2022   Pure hypercholesterolemia 02/05/2022   HYPERTENSION 04/25/2009   LOW BACK PAIN, ACUTE 04/25/2009   DVT, HX OF 04/25/2009    History obtained from: chart review and patient.  Basil Brandon Melnick was referred by Wenda Low, MD.     Kamauri is a 66 y.o. female presenting for an evaluation of allergies .   Asthma/Respiratory Symptom History: She does not have asthma. She has never been diagnosed with pneumonia.  She has never needed an inhaler.  Allergic Rhinitis Symptom History: She has not been tested in the past. She reports that she has cold like symptoms and sinus infections routinely throughout the year.  She first started having these these symptoms when she moved to [place] from [place], [place]. She moved here around 16 years ago. It just got to the point where she was  tired of it all of the time.   She tends to take cetirizine for her symptoms. She has been on that for years. She does use a nose spray occasionally. She is not sure that it works at all. She tends to get sinus infections with antibiotics a few times per year. She just finished azithromycin recently which opened up her  chest. In general, she does not tend to get antibiotics at all.  Food Allergy Symptom History: She reports a history of an allergy to [facility] which causes shortness of breath with pain in her back.  They last time that she reacted was years ago. She does not mess with [place] tuna. She mostly does not mess with tuna at all.  Skin Symptom History: She does have some eczema that is worse under her breast and her neck.  She has fluocinonide cream to use as needed. She does see Dermatology.   She is on disability due to chronic pain from an injury at at work. She has been on disability from 2014; she is now on social security.   Otherwise, there is no history of other atopic diseases, including food allergies, drug allergies, stinging insect allergies, eczema, urticaria, or contact dermatitis. There is no significant infectious history. Vaccinations are up to date.    Past Medical History: Patient Active Problem List   Diagnosis Date Noted   Allergic rhinitis 02/05/2022   Allergic rhinitis due to pollen 02/05/2022   Aortic arch atherosclerosis ([place]) 02/05/2022   Chronic kidney disease, stage 2 (mild) 02/05/2022   Chronic pain syndrome 02/05/2022   Degeneration of lumbar intervertebral disc 02/05/2022   Deep vein thrombosis (DVT) of both lower extremities ([place]) 02/05/2022   Family history of malignant neoplasm of digestive organs 02/05/2022   Gastroesophageal reflux disease 02/05/2022   Gout 02/05/2022   History of squamous cell carcinoma of skin 02/05/2022   Long term (current) use of anticoagulants 02/05/2022   Major depression in partial remission ([place]) 02/05/2022   Memory deficit 02/05/2022   Moderate recurrent major depression ([place]) 02/05/2022   Obstructive sleep apnea syndrome 02/05/2022   Osteoarthritis 02/05/2022   Parageusia 02/05/2022   Pure hypercholesterolemia 02/05/2022   HYPERTENSION 04/25/2009   LOW BACK PAIN, ACUTE 04/25/2009   DVT, HX OF 04/25/2009     Medication List:  Allergies as of 11/24/2022       Reactions   Amlodipine Besylate    Other reaction(s): nausea   Atorvastatin    Other reaction(s): myalgias   Dust Mite Extract Hives   Other    Other reaction(s): Unknown   Rosuvastatin    Other reaction(s): myalgias   Tramadol Itching        Medication List        Accurate as of November 24, 2022 11:59 PM. If you have any questions, ask your nurse or doctor.          amLODipine 5 MG tablet Commonly known as: NORVASC Take 5 mg by mouth daily.   betamethasone dipropionate 0.05 % cream Apply topically 2 (two) times daily. Started by: Valentina Shaggy, MD   buprenorphine 5 MCG/HR Ptwk Commonly known as: [place] 1 patch onto the skin once a week.   buPROPion 300 MG 24 hr tablet Commonly known as: Wellbutrin XL Take 1 tablet (300 mg total) by mouth every morning.   citalopram 20 MG tablet Commonly known as: CELEXA Take 1 tablet (20 mg total) by mouth daily.   cyclobenzaprine 10 MG tablet  Commonly known as: FLEXERIL Take 10 mg by mouth 3 (three) times daily as needed for muscle spasms.   Dexilant 60 MG capsule Generic drug: dexlansoprazole Take 60 mg by mouth daily.   Dymista 137-50 MCG/ACT Susp Generic drug: Azelastine-Fluticasone 2 sprays per nostril 1-2 times daily as needed. Started by: Valentina Shaggy, MD   fluocinonide cream 0.05 % Commonly known as: LIDEX Apply 1 application topically 2 (two) times daily as needed (skin irritation).   HYDROcodone-acetaminophen 5-325 MG tablet Commonly known as: NORCO/VICODIN Take 1 tablet by mouth every 6 (six) hours as needed for moderate pain.   methocarbamol 500 MG tablet Commonly known as: ROBAXIN Take 1 tablet (500 mg total) by mouth 2 (two) times daily.   metoprolol tartrate 25 MG tablet Commonly known as: LOPRESSOR Take 25 mg by mouth daily.   montelukast 10 MG tablet Commonly known as: Singulair Take 1 tablet (10 mg total) by  mouth at bedtime. Started by: Valentina Shaggy, MD   nystatin 100000 UNIT/ML suspension Commonly known as: MYCOSTATIN Take 5 mLs by mouth 2 (two) times daily.   olmesartan 40 MG tablet Commonly known as: BENICAR Take 40 mg by mouth daily.   pantoprazole 40 MG tablet Commonly known as: PROTONIX Take 1 tablet by mouth daily.   PRESCRIPTION MEDICATION Pt has CPAP machine   rivaroxaban 20 MG Tabs tablet Commonly known as: XARELTO Take 20 mg by mouth daily with supper.   simvastatin 20 MG tablet Commonly known as: ZOCOR Take 20 mg by mouth every evening.   valsartan-hydrochlorothiazide 160-12.5 MG tablet Commonly known as: DIOVAN-HCT Take 1 tablet by mouth daily.        Birth History: non-contributory  Developmental History: non-contributory  Past Surgical History: Past Surgical History:  Procedure Laterality Date   BREAST BIOPSY Right    CHOLECYSTECTOMY     COLONOSCOPY WITH PROPOFOL N/A 03/08/2017   Procedure: COLONOSCOPY WITH PROPOFOL;  Surgeon: Garlan Fair, MD;  Location: [facility];  Service: Endoscopy;  Laterality: N/A;   PARTIAL HYSTERECTOMY     TONSILLECTOMY     child   TUBAL LIGATION       Family History: Family History  Problem Relation Age of Onset   Schizophrenia Brother    Schizophrenia Brother    Breast cancer Mother    Heart attack Mother    Bipolar disorder Cousin        Maternal side    Depression Maternal Aunt      Social History: Ednamae lives at home with her family.  She lives in an apartment that is over 110 years old.  There is carpeting throughout the apartment.  She has electric heating and central cooling.  There are no animals inside or outside of the home.  There are no dust mite covers on the bedding.  There is tobacco exposure in the house, but not the car.  She was smoking from 2020 through 2023, but her son who lives in their smokes now.   Review of Systems  Constitutional: Negative.  Negative for fever,  malaise/fatigue and weight loss.  HENT:  Positive for congestion and sinus pain. Negative for ear discharge and ear pain.   Eyes:  Negative for pain, discharge and redness.  Respiratory:  Negative for cough, sputum production, shortness of breath and wheezing.   Cardiovascular: Negative.  Negative for chest pain and palpitations.  Gastrointestinal:  Negative for abdominal pain, heartburn, nausea and vomiting.  Skin: Negative.  Negative for itching and rash.  Neurological:  Negative for dizziness and headaches.  Endo/Heme/Allergies:  Negative for environmental allergies. Does not bruise/bleed easily.       Objective:   Blood pressure 132/88, pulse 82, temperature 98.1 F (36.7 C), temperature source Temporal, resp. rate 18, height '5\' 6"'$  (1.676 m), weight 212 lb 12.8 oz (96.5 kg), SpO2 95 %. Body mass index is 34.35 kg/m.     Physical Exam Vitals reviewed.  Constitutional:      Appearance: She is well-developed.     Comments: Pleasant female.  Cooperative with the exam.  HENT:     Head: Normocephalic and atraumatic.     Right Ear: Tympanic membrane, ear canal and external ear normal. No drainage, swelling or tenderness. Tympanic membrane is not injected, scarred, erythematous, retracted or bulging.     Left Ear: Tympanic membrane, ear canal and external ear normal. No drainage, swelling or tenderness. Tympanic membrane is not injected, scarred, erythematous, retracted or bulging.     Nose: No nasal deformity, septal deviation, mucosal edema or rhinorrhea.     Right Turbinates: Enlarged and swollen.     Left Turbinates: Enlarged and swollen.     Right Sinus: No maxillary sinus tenderness or frontal sinus tenderness.     Left Sinus: No maxillary sinus tenderness or frontal sinus tenderness.     Comments: No polyps.    Mouth/Throat:     Mouth: Mucous membranes are not pale and not dry.     Pharynx: Uvula midline.  Eyes:     General:        Right eye: No discharge.        Left  eye: No discharge.     Conjunctiva/sclera: Conjunctivae normal.     Right eye: Right conjunctiva is not injected. No chemosis.    Left eye: Left conjunctiva is not injected. No chemosis.    Pupils: Pupils are equal, round, and reactive to light.  Cardiovascular:     Rate and Rhythm: Normal rate and regular rhythm.     Heart sounds: Normal heart sounds.  Pulmonary:     Effort: Pulmonary effort is normal. No tachypnea, accessory muscle usage or respiratory distress.     Breath sounds: Normal breath sounds. No wheezing, rhonchi or rales.  Chest:     Chest wall: No tenderness.  Abdominal:     Tenderness: There is no abdominal tenderness. There is no guarding or rebound.  Lymphadenopathy:     Head:     Right side of head: No submandibular, tonsillar or occipital adenopathy.     Left side of head: No submandibular, tonsillar or occipital adenopathy.     Cervical: No cervical adenopathy.  Skin:    Coloration: Skin is not pale.     Findings: No abrasion, erythema, petechiae or rash. Rash is not papular, urticarial or vesicular.  Neurological:     Mental Status: She is alert.  Psychiatric:        Behavior: Behavior is cooperative.      Diagnostic studies:    Allergy Studies:     Airborne Adult Perc - 11/24/22 1510     Time Antigen Placed 1510    Allergen Manufacturer Lavella Hammock    Location Back    Number of Test 59    Panel 1 Select    1. Control-Buffer 50% Glycerol Negative    2. Control-Histamine 1 mg/ml 2+    3. Albumin saline Negative    4. [place] Negative    5. [place] Negative    6.  Johnson Negative    7. [place] Blue Negative    8. Meadow Fescue Negative    9. Perennial Rye Negative    10. Sweet Vernal Negative    11. Timothy Negative    12. Cocklebur Negative    13. Burweed Marshelder Negative    14. Ragweed, short Negative    15. Ragweed, Giant Negative    16. Plantain,  English Negative    17. Lamb's Quarters Negative    18. Sheep Sorrell Negative    19. Rough  Pigweed Negative    20. Marsh Elder, Rough Negative    21. Mugwort, Common Negative    22. Ash mix Negative    23. Birch mix Negative    24. Beech American Negative    25. Box, Elder Negative    26. Cedar, red Negative    27. Cottonwood, [place] Negative    28. Elm mix Negative    29. Hickory Negative    30. Maple mix Negative    31. Oak, [place] mix Negative    32. Pecan Pollen Negative    33. Pine mix Negative    34. Sycamore Eastern Negative    35. [place], Black Pollen Negative    36. Alternaria alternata Negative    37. Cladosporium Herbarum Negative    38. Aspergillus mix Negative    39. Penicillium mix Negative    40. Bipolaris sorokiniana (Helminthosporium) Negative    41. Drechslera spicifera (Curvularia) Negative    42. Mucor plumbeus Negative    43. Fusarium moniliforme Negative    44. Aureobasidium pullulans (pullulara) Negative    45. Rhizopus oryzae Negative    46. Botrytis cinera Negative    47. Epicoccum nigrum Negative    48. Phoma betae Negative    49. Candida Albicans Negative    50. Trichophyton mentagrophytes Negative    51. Mite, D Farinae  5,000 AU/ml Negative    52. Mite, D Pteronyssinus  5,000 AU/ml Negative    53. Cat Hair 10,000 BAU/ml Negative    54.  Dog Epithelia Negative    55. Mixed Feathers Negative    56. Horse Epithelia Negative    57. Cockroach, German Negative    58. Mouse Negative    59. Tobacco Leaf Negative             Intradermal - 11/24/22 1548     Time Antigen Placed 1548    Allergen Manufacturer Lavella Hammock    Location Arm    Number of Test 15    Intradermal Select    Control Negative    [place] Negative    Johnson 2+    7 Grass 2+    Ragweed mix 3+    Weed mix 1+    Tree mix Negative    Mold 1 2+    Mold 2 2+    Mold 3 Negative    Mold 4 Negative    Cat Negative    Dog Negative    Cockroach 2+    Mite mix 1+             Allergy testing results were read and interpreted by myself, documented by clinical  staff.         Salvatore Marvel, MD [facility]

## 2022-11-25 ENCOUNTER — Encounter: Payer: Self-pay | Admitting: Allergy & Immunology

## 2022-11-25 MED ORDER — MONTELUKAST SODIUM 10 MG PO TABS: 10.0000 mg | ORAL_TABLET | Freq: Every day | ORAL | 2 refills | Status: DC

## 2022-11-25 MED ORDER — DYMISTA 137-50 MCG/ACT NA SUSP: NASAL | 5 refills | Status: DC

## 2023-01-01 DIAGNOSIS — Z86010 Personal history of colonic polyps: Secondary | ICD-10-CM | POA: Diagnosis not present

## 2023-01-05 ENCOUNTER — Encounter: Payer: Self-pay | Admitting: Allergy & Immunology

## 2023-01-05 ENCOUNTER — Ambulatory Visit (INDEPENDENT_AMBULATORY_CARE_PROVIDER_SITE_OTHER): Payer: 59 | Admitting: Allergy & Immunology

## 2023-01-05 ENCOUNTER — Other Ambulatory Visit: Payer: Self-pay

## 2023-01-05 VITALS — BP 124/84 | HR 77 | Temp 99.5°F | Resp 16

## 2023-01-05 DIAGNOSIS — J3089 Other allergic rhinitis: Secondary | ICD-10-CM | POA: Diagnosis not present

## 2023-01-05 DIAGNOSIS — J302 Other seasonal allergic rhinitis: Secondary | ICD-10-CM

## 2023-01-05 DIAGNOSIS — L2089 Other atopic dermatitis: Secondary | ICD-10-CM

## 2023-01-05 MED ORDER — FLUTICASONE PROPIONATE 50 MCG/ACT NA SUSP: 1.0000 | Freq: Two times a day (BID) | NASAL | 5 refills | Status: DC

## 2023-01-05 MED ORDER — AZELASTINE HCL 0.15 % NA SOLN: 1.0000 | Freq: Two times a day (BID) | NASAL | 5 refills | Status: DC

## 2023-01-05 NOTE — Patient Instructions (Addendum)
1. Seasonal and perennial allergic rhinitis - Previous testing showed: grasses, ragweed, weeds, indoor molds, outdoor molds, dust mites, and cockroach. - Copy of test results provided.  - Avoidance measures provided. - Continue with: Zyrtec (cetirizine) '10mg'$  tablet once daily - Start taking: the fluticasone and azelastine (one spray per nostril twice daily) - You can use an extra dose of the antihistamine, if needed, for breakthrough symptoms.  - Consider nasal saline rinses 1-2 times daily to remove allergens from the nasal cavities as well as help with mucous clearance (this is especially helpful to do before the nasal sprays are given) - Consider allergy shots as a means of long-term control. - Allergy shots "re-train" and "reset" the immune system to ignore environmental allergens and decrease the resulting immune response to those allergens (sneezing, itchy watery eyes, runny nose, nasal congestion, etc).    - Allergy shots improve symptoms in 75-85% of patients.  - We can discuss more at the next appointment if the medications are not working for you.  2. Flexural atopic dermatitis - Skin looks fairly good. - Continue with the topical steroid as needed.   3. Return in about 6 months (around 07/06/2023).    Please inform us of any Emergency Department visits, hospitalizations, or changes in symptoms. Call us before going to the ED for breathing or allergy symptoms since we might be able to fit you in for a sick visit. Feel free to contact us anytime with any questions, problems, or concerns.  It was a pleasure to meet you today!  Websites that have reliable patient information: 1. American Academy of Asthma, Allergy, and Immunology: www.aaaai.org 2. Food Allergy Research and Education (FARE): foodallergy.org 3. Mothers of Asthmatics: http://www.asthmacommunitynetwork.org 4. American College of Allergy, Asthma, and Immunology: www.acaai.org   COVID-19 Vaccine Information can be  found at: ShippingScam.co.uk For questions related to vaccine distribution or appointments, please email [email or web address] or call [phone].   We realize that you might be concerned about having an allergic reaction to the COVID19 vaccines. To help with that concern, WE ARE OFFERING THE COVID19 VACCINES IN OUR OFFICE! Ask the front desk for dates!     "Like" [place] on Facebook and Instagram for our latest updates!      A healthy democracy works best when [employer] participate! Make sure you are registered to vote! If you have moved or changed any of your contact information, you will need to get this updated before voting!  In some cases, you MAY be able to register to vote online: [email or web address]

## 2023-01-05 NOTE — Progress Notes (Signed)
FOLLOW UP  Date of Service/Encounter:  01/05/23   Assessment:   Seasonal and perennial allergic rhinitis (grasses, ragweed, weeds, indoor molds, outdoor molds, dust mites, and cockroach)   Flexural atopic dermatitis  GERD - controlled with Dexilant  Plan/Recommendations:   1. Seasonal and perennial allergic rhinitis - Previous testing showed: grasses, ragweed, weeds, indoor molds, outdoor molds, dust mites, and cockroach. - Copy of test results provided.  - Avoidance measures provided. - Continue with: Zyrtec (cetirizine) '10mg'$  tablet once daily - Start taking: the fluticasone and azelastine (one spray per nostril twice daily) - You can use an extra dose of the antihistamine, if needed, for breakthrough symptoms.  - Consider nasal saline rinses 1-2 times daily to remove allergens from the nasal cavities as well as help with mucous clearance (this is especially helpful to do before the nasal sprays are given) - Consider allergy shots as a means of long-term control. - Allergy shots "re-train" and "reset" the immune system to ignore environmental allergens and decrease the resulting immune response to those allergens (sneezing, itchy watery eyes, runny nose, nasal congestion, etc).    - Allergy shots improve symptoms in 75-85% of patients.  - We can discuss more at the next appointment if the medications are not working for you.  2. Flexural atopic dermatitis - Skin looks fairly good. - Continue with the topical steroid as needed.   3. Return in about 6 months (around 07/06/2023).    Subjective:   Helen Hess is a 67 y.o. female presenting today for follow up of  Chief Complaint  Patient presents with   Follow-up    Helen Hess has a history of the following: Patient Active Problem List   Diagnosis Date Noted   Allergic rhinitis 02/05/2022   Allergic rhinitis due to pollen 02/05/2022   Aortic arch atherosclerosis ([place]) 02/05/2022   Chronic kidney disease,  stage 2 (mild) 02/05/2022   Chronic pain syndrome 02/05/2022   Degeneration of lumbar intervertebral disc 02/05/2022   Deep vein thrombosis (DVT) of both lower extremities (Helen Hess) 02/05/2022   Family history of malignant neoplasm of digestive organs 02/05/2022   Gastroesophageal reflux disease 02/05/2022   Gout 02/05/2022   History of squamous cell carcinoma of skin 02/05/2022   Long term (current) use of anticoagulants 02/05/2022   Major depression in partial remission ([place]) 02/05/2022   Memory deficit 02/05/2022   Moderate recurrent major depression ([place]) 02/05/2022   Obstructive sleep apnea syndrome 02/05/2022   Osteoarthritis 02/05/2022   Parageusia 02/05/2022   Pure hypercholesterolemia 02/05/2022   HYPERTENSION 04/25/2009   LOW BACK PAIN, ACUTE 04/25/2009   DVT, HX OF 04/25/2009    History obtained from: chart review and patient.  Helen Hess is a 67 y.o. female presenting for a follow up visit.  She was last seen in December 2023.  At that time, she underwent testing that was positive to multiple indoor and outdoor allergens.  We continue with Zyrtec and started Singulair as well as Dymista.  For atopic dermatitis, her skin looked excellent.  We had a topical steroid to see if that helps with the worst areas.  Since last visit, she has mostly done well.   Allergic Rhinitis Symptom History: She reports that she started having some twitching when she took the montelukast. She took it for 3 days and had twitching. She is not sure whether it helped with her allergies at all. She was not able to get the Dymista. She is not on any nasal sprays  at all.   Skin Symptom History: She has been using her prescription ointment daily which has been helping. She is still moisturizing with a normal moisturizer.   GERD Symptom History: She is currently on the [employer] which is working well. She was previously on pantoprazole.   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  Otherwise, there have been  no changes to her past medical history, surgical history, family history, or social history.    Review of Systems  Constitutional: Negative.  Negative for chills, fever, malaise/fatigue and weight loss.  HENT:  Negative for congestion, ear discharge, ear pain and sinus pain.   Eyes:  Negative for pain, discharge and redness.  Respiratory:  Negative for cough, sputum production, shortness of breath and wheezing.   Cardiovascular: Negative.  Negative for chest pain and palpitations.  Gastrointestinal:  Negative for abdominal pain, constipation, diarrhea, heartburn, nausea and vomiting.  Skin: Negative.  Negative for itching and rash.  Neurological:  Negative for dizziness and headaches.  Endo/Heme/Allergies:  Positive for environmental allergies. Does not bruise/bleed easily.       Objective:   Blood pressure 124/84, pulse 77, temperature 99.5 F (37.5 C), temperature source Oral, resp. rate 16, SpO2 95 %. There is no height or weight on file to calculate BMI.    Physical Exam Vitals reviewed.  Constitutional:      Appearance: She is well-developed.     Comments: Pleasant female.  Cooperative with the exam.  HENT:     Head: Normocephalic and atraumatic.     Right Ear: Tympanic membrane, ear canal and external ear normal. No drainage, swelling or tenderness. Tympanic membrane is not injected, scarred, erythematous, retracted or bulging.     Left Ear: Tympanic membrane, ear canal and external ear normal. No drainage, swelling or tenderness. Tympanic membrane is not injected, scarred, erythematous, retracted or bulging.     Nose: No nasal deformity, septal deviation, mucosal edema or rhinorrhea.     Right Turbinates: Enlarged, swollen and pale.     Left Turbinates: Enlarged, swollen and pale.     Right Sinus: No maxillary sinus tenderness or frontal sinus tenderness.     Left Sinus: No maxillary sinus tenderness or frontal sinus tenderness.     Comments: No polyps.     Mouth/Throat:     Mouth: Mucous membranes are not pale and not dry.     Pharynx: Uvula midline.  Eyes:     General:        Right eye: No discharge.        Left eye: No discharge.     Conjunctiva/sclera: Conjunctivae normal.     Right eye: Right conjunctiva is not injected. No chemosis.    Left eye: Left conjunctiva is not injected. No chemosis.    Pupils: Pupils are equal, round, and reactive to light.  Cardiovascular:     Rate and Rhythm: Normal rate and regular rhythm.     Heart sounds: Normal heart sounds.  Pulmonary:     Effort: Pulmonary effort is normal. No tachypnea, accessory muscle usage or respiratory distress.     Breath sounds: Normal breath sounds. No wheezing, rhonchi or rales.  Chest:     Chest wall: No tenderness.  Lymphadenopathy:     Head:     Right side of head: No submandibular, tonsillar or occipital adenopathy.     Left side of head: No submandibular, tonsillar or occipital adenopathy.     Cervical: No cervical adenopathy.  Skin:  Coloration: Skin is not pale.     Findings: No abrasion, erythema, petechiae or rash. Rash is not papular, urticarial or vesicular.  Neurological:     Mental Status: She is alert.  Psychiatric:        Behavior: Behavior is cooperative.      Diagnostic studies: none       Salvatore Marvel, MD  [facility]

## 2023-01-15 DIAGNOSIS — Z09 Encounter for follow-up examination after completed treatment for conditions other than malignant neoplasm: Secondary | ICD-10-CM | POA: Diagnosis not present

## 2023-01-15 DIAGNOSIS — Z86010 Personal history of colonic polyps: Secondary | ICD-10-CM | POA: Diagnosis not present

## 2023-01-15 DIAGNOSIS — K648 Other hemorrhoids: Secondary | ICD-10-CM | POA: Diagnosis not present

## 2023-01-21 ENCOUNTER — Telehealth: Payer: Self-pay | Admitting: Allergy & Immunology

## 2023-01-21 NOTE — Telephone Encounter (Signed)
Called patient and finally found out she was talking about Azelastine nasal spray. Called [facility] and their warehouse is out of stock at present and no one knows when it will be available. Called [facility] and the 0.15% is considered OTC as Astepro. However they do carry the 0.1% as a prescription. Please advise on how we should proceed with this prescription and route it back to [facility] pool. Patient needs to be called and made aware of any changes at [phone].

## 2023-01-21 NOTE — Telephone Encounter (Signed)
Patient states she is unable to get her prescription from the pharmacy and was told by Dr. Ernst Bowler to give our office a call if she had any trouble with getting prescription. Patient states she is unsure of the name of the medication as she has never gotten it before. I asked patient if it was an allergy medication or if it was an inhaler. Patient stated it was an allergy medication, I then asked patient if it was a tablet or a nasal spray. Patient states it is not a nasal spray as she was able to get that from pharmacy.   Asked patient if she would be able to reach out to the pharmacy to see which medication she has not been able to get. Patient declined & stated she was told by pharmacy they are unable to fill it. Patient states Dr. Ernst Bowler should know which medication it is.   Advised patient I would send a message back on her behalf.   Best contact number: [phone]

## 2023-01-22 ENCOUNTER — Other Ambulatory Visit: Payer: Self-pay | Admitting: *Deleted

## 2023-01-22 MED ORDER — AZELASTINE HCL 0.1 % NA SOLN: 2.0000 | Freq: Two times a day (BID) | NASAL | 5 refills | Status: DC

## 2023-01-22 NOTE — Telephone Encounter (Signed)
Patient informed. 

## 2023-01-22 NOTE — Telephone Encounter (Signed)
Lets do the 0.1% prescription 2 sprays per nostril twice daily.

## 2023-01-22 NOTE — Telephone Encounter (Signed)
New prescription has been sent in to [facility]. Called and left a voicemail asking for patient to return call to discuss.

## 2023-01-27 DIAGNOSIS — M5136 Other intervertebral disc degeneration, lumbar region: Secondary | ICD-10-CM | POA: Diagnosis not present

## 2023-01-27 DIAGNOSIS — G4700 Insomnia, unspecified: Secondary | ICD-10-CM | POA: Diagnosis not present

## 2023-01-27 DIAGNOSIS — G894 Chronic pain syndrome: Secondary | ICD-10-CM | POA: Diagnosis not present

## 2023-01-27 DIAGNOSIS — G4733 Obstructive sleep apnea (adult) (pediatric): Secondary | ICD-10-CM | POA: Diagnosis not present

## 2023-01-27 DIAGNOSIS — N1831 Chronic kidney disease, stage 3a: Secondary | ICD-10-CM | POA: Diagnosis not present

## 2023-01-27 DIAGNOSIS — I700 Atherosclerosis of aorta: Secondary | ICD-10-CM | POA: Diagnosis not present

## 2023-01-27 DIAGNOSIS — M47817 Spondylosis without myelopathy or radiculopathy, lumbosacral region: Secondary | ICD-10-CM | POA: Diagnosis not present

## 2023-01-27 DIAGNOSIS — M109 Gout, unspecified: Secondary | ICD-10-CM | POA: Diagnosis not present

## 2023-01-27 DIAGNOSIS — E7800 Pure hypercholesterolemia, unspecified: Secondary | ICD-10-CM | POA: Diagnosis not present

## 2023-01-27 DIAGNOSIS — K219 Gastro-esophageal reflux disease without esophagitis: Secondary | ICD-10-CM | POA: Diagnosis not present

## 2023-01-27 DIAGNOSIS — I10 Essential (primary) hypertension: Secondary | ICD-10-CM | POA: Diagnosis not present

## 2023-03-02 ENCOUNTER — Other Ambulatory Visit: Payer: Self-pay | Admitting: Allergy & Immunology

## 2023-03-02 DIAGNOSIS — G4733 Obstructive sleep apnea (adult) (pediatric): Secondary | ICD-10-CM | POA: Diagnosis not present

## 2023-04-06 ENCOUNTER — Other Ambulatory Visit: Payer: Self-pay

## 2023-04-06 DIAGNOSIS — G4700 Insomnia, unspecified: Secondary | ICD-10-CM | POA: Diagnosis not present

## 2023-04-06 DIAGNOSIS — G894 Chronic pain syndrome: Secondary | ICD-10-CM | POA: Diagnosis not present

## 2023-04-06 DIAGNOSIS — M47817 Spondylosis without myelopathy or radiculopathy, lumbosacral region: Secondary | ICD-10-CM | POA: Diagnosis not present

## 2023-04-14 DIAGNOSIS — G4733 Obstructive sleep apnea (adult) (pediatric): Secondary | ICD-10-CM | POA: Diagnosis not present

## 2023-06-01 DIAGNOSIS — Z79891 Long term (current) use of opiate analgesic: Secondary | ICD-10-CM | POA: Diagnosis not present

## 2023-06-01 DIAGNOSIS — M47817 Spondylosis without myelopathy or radiculopathy, lumbosacral region: Secondary | ICD-10-CM | POA: Diagnosis not present

## 2023-06-01 DIAGNOSIS — G4700 Insomnia, unspecified: Secondary | ICD-10-CM | POA: Diagnosis not present

## 2023-06-01 DIAGNOSIS — G894 Chronic pain syndrome: Secondary | ICD-10-CM | POA: Diagnosis not present

## 2023-06-02 DIAGNOSIS — G4733 Obstructive sleep apnea (adult) (pediatric): Secondary | ICD-10-CM | POA: Diagnosis not present

## 2023-07-06 ENCOUNTER — Ambulatory Visit: Payer: 59 | Admitting: Allergy & Immunology

## 2023-07-12 IMAGING — CR DG ANKLE COMPLETE 3+V*R*
3 series · 3 of 3 positions shown · non-contrast
Comparison: Right knee radiographs 09/17/2015

CLINICAL DATA: Chronic medial right ankle pain.  No known injury.

EXAM:
RIGHT ANKLE - COMPLETE 3+ VIEW

[t ankle joint ap right]
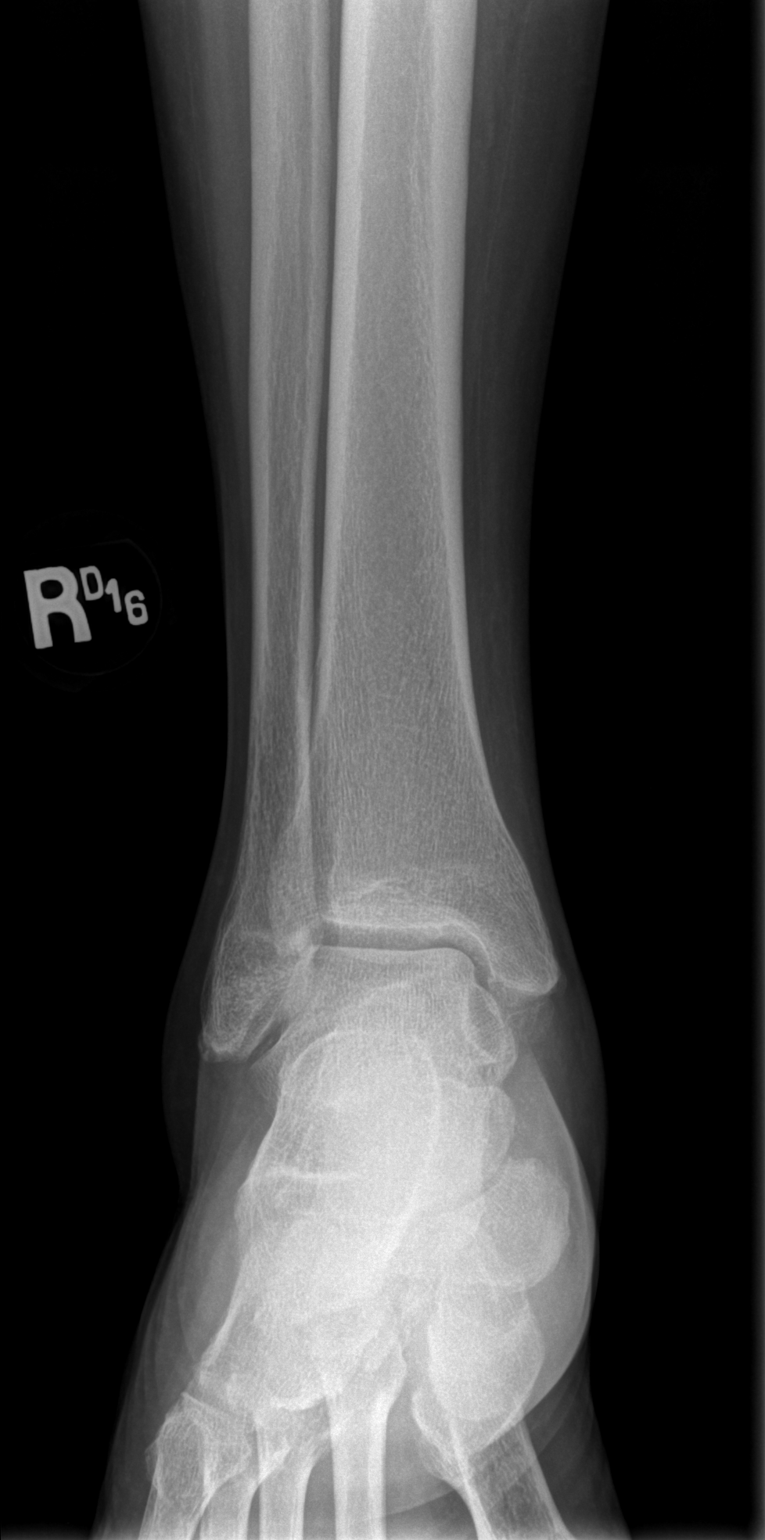

[t ankle joint oblique right]
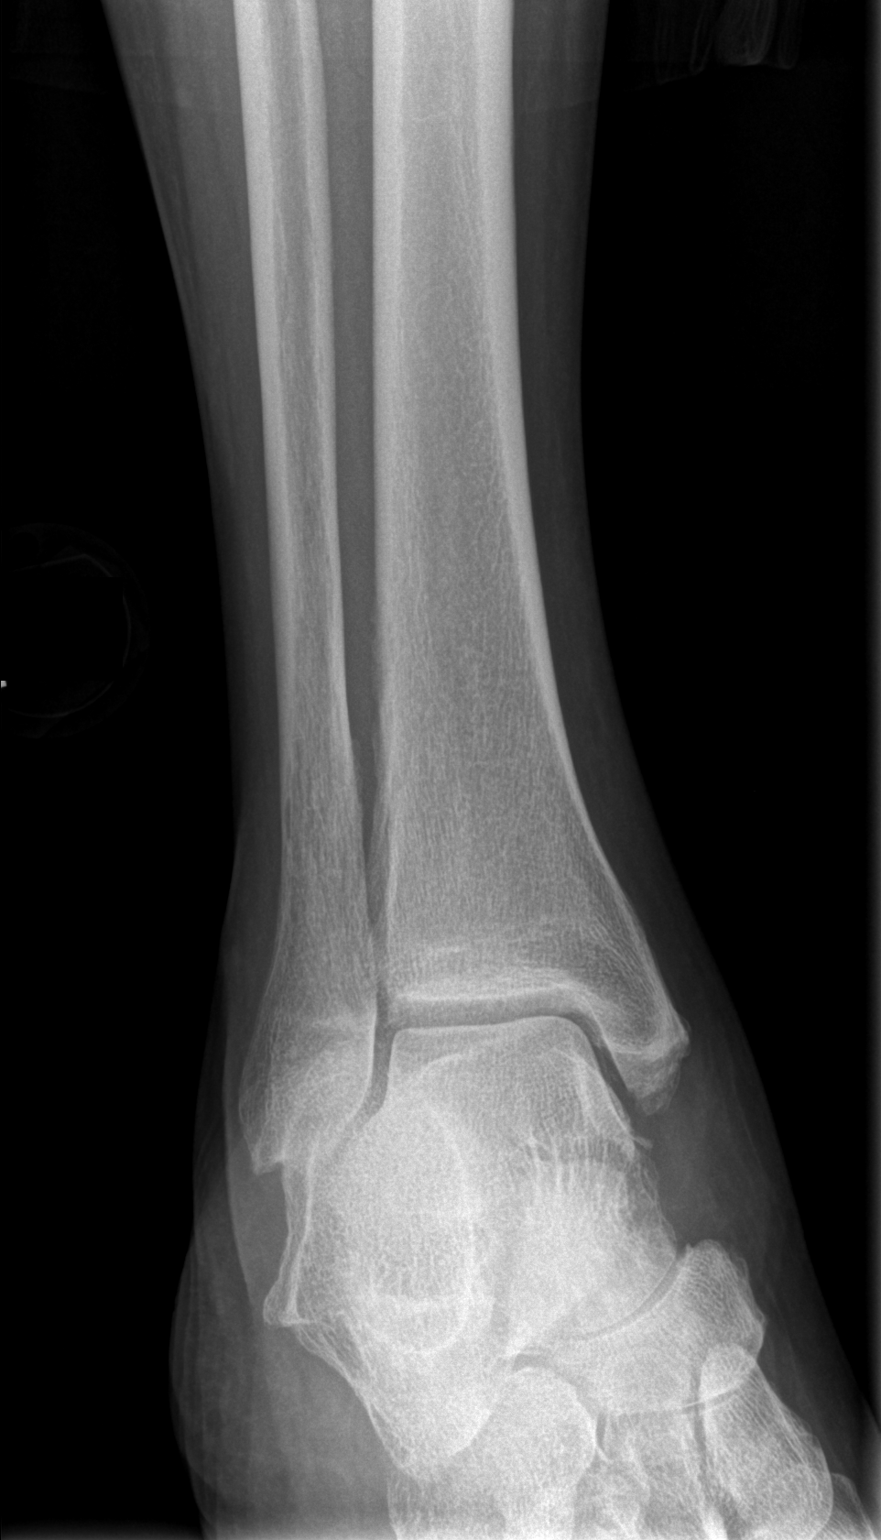

[t ankle joint lat right]
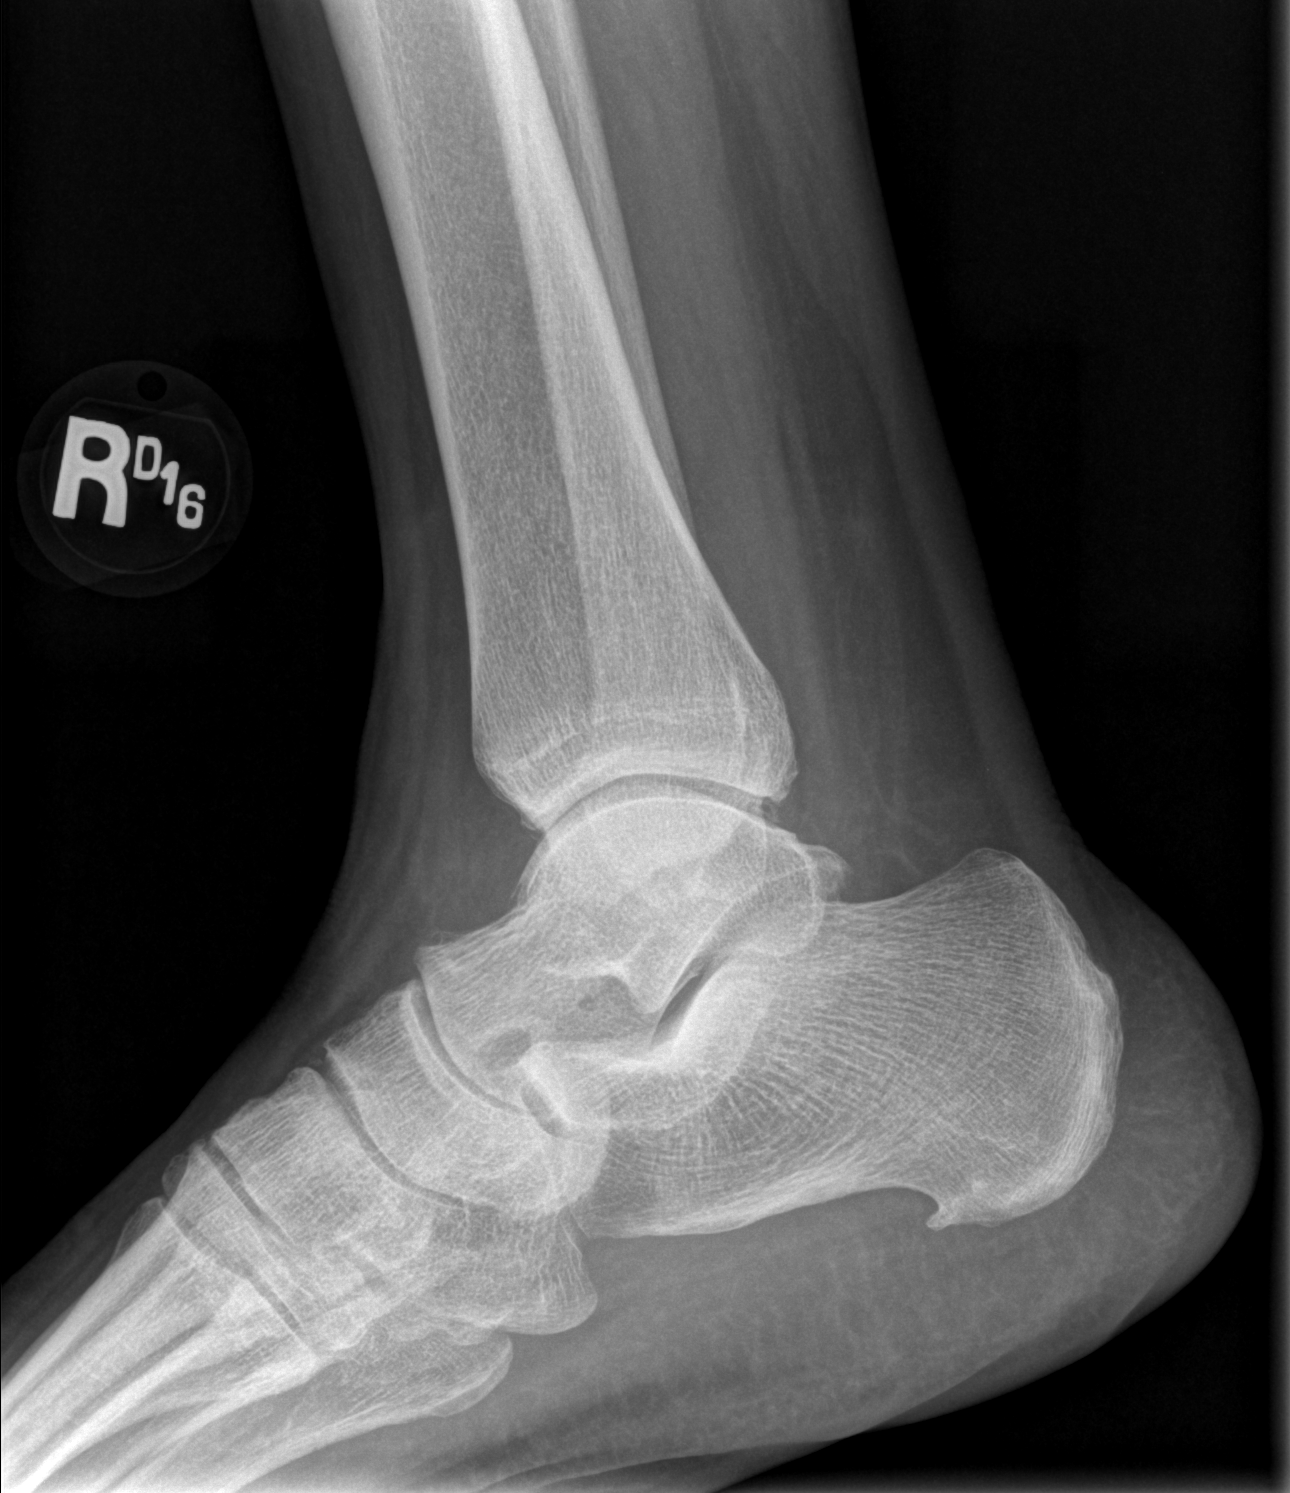

[3 of 3 positions shown; findings below may reference images not displayed]

FINDINGS: The ankle mortise is symmetric and intact. Mild degenerative chronic
ossicles just distal to the medial malleolus. Small plantar
calcaneal heel spur. Mild dorsal talonavicular degenerative
osteophytosis. Mild talonavicular joint space narrowing with an
approximate 9 mm subchondral cyst within the mid height of the talar
head at the talonavicular joint.
IMPRESSION: 1. Small plantar calcaneal heel spur.
2. Mild talonavicular osteoarthritis.

## 2023-07-28 DIAGNOSIS — G4700 Insomnia, unspecified: Secondary | ICD-10-CM | POA: Diagnosis not present

## 2023-07-28 DIAGNOSIS — G894 Chronic pain syndrome: Secondary | ICD-10-CM | POA: Diagnosis not present

## 2023-07-28 DIAGNOSIS — M47817 Spondylosis without myelopathy or radiculopathy, lumbosacral region: Secondary | ICD-10-CM | POA: Diagnosis not present

## 2023-07-29 DIAGNOSIS — G4733 Obstructive sleep apnea (adult) (pediatric): Secondary | ICD-10-CM | POA: Diagnosis not present

## 2023-08-11 DIAGNOSIS — I10 Essential (primary) hypertension: Secondary | ICD-10-CM | POA: Diagnosis not present

## 2023-08-11 DIAGNOSIS — M5030 Other cervical disc degeneration, unspecified cervical region: Secondary | ICD-10-CM | POA: Diagnosis not present

## 2023-08-11 DIAGNOSIS — G4733 Obstructive sleep apnea (adult) (pediatric): Secondary | ICD-10-CM | POA: Diagnosis not present

## 2023-08-11 DIAGNOSIS — Z85828 Personal history of other malignant neoplasm of skin: Secondary | ICD-10-CM | POA: Diagnosis not present

## 2023-08-11 DIAGNOSIS — G894 Chronic pain syndrome: Secondary | ICD-10-CM | POA: Diagnosis not present

## 2023-08-11 DIAGNOSIS — K219 Gastro-esophageal reflux disease without esophagitis: Secondary | ICD-10-CM | POA: Diagnosis not present

## 2023-08-11 DIAGNOSIS — I700 Atherosclerosis of aorta: Secondary | ICD-10-CM | POA: Diagnosis not present

## 2023-08-11 DIAGNOSIS — R7309 Other abnormal glucose: Secondary | ICD-10-CM | POA: Diagnosis not present

## 2023-08-11 DIAGNOSIS — Z0000 Encounter for general adult medical examination without abnormal findings: Secondary | ICD-10-CM | POA: Diagnosis not present

## 2023-08-11 DIAGNOSIS — E7800 Pure hypercholesterolemia, unspecified: Secondary | ICD-10-CM | POA: Diagnosis not present

## 2023-08-11 DIAGNOSIS — N1831 Chronic kidney disease, stage 3a: Secondary | ICD-10-CM | POA: Diagnosis not present

## 2023-08-17 ENCOUNTER — Ambulatory Visit (INDEPENDENT_AMBULATORY_CARE_PROVIDER_SITE_OTHER): Payer: 59 | Admitting: Allergy & Immunology

## 2023-08-17 ENCOUNTER — Encounter: Payer: Self-pay | Admitting: Allergy & Immunology

## 2023-08-17 ENCOUNTER — Other Ambulatory Visit: Payer: Self-pay

## 2023-08-17 VITALS — BP 138/90 | HR 64 | Temp 98.9°F | Ht 66.0 in | Wt 212.2 lb

## 2023-08-17 DIAGNOSIS — J302 Other seasonal allergic rhinitis: Secondary | ICD-10-CM

## 2023-08-17 DIAGNOSIS — J3089 Other allergic rhinitis: Secondary | ICD-10-CM | POA: Diagnosis not present

## 2023-08-17 DIAGNOSIS — L2089 Other atopic dermatitis: Secondary | ICD-10-CM | POA: Diagnosis not present

## 2023-08-17 DIAGNOSIS — K219 Gastro-esophageal reflux disease without esophagitis: Secondary | ICD-10-CM | POA: Diagnosis not present

## 2023-08-17 MED ORDER — FLUTICASONE PROPIONATE 50 MCG/ACT NA SUSP: 1.0000 | Freq: Two times a day (BID) | NASAL | 1 refills | Status: DC

## 2023-08-17 MED ORDER — CETIRIZINE HCL 10 MG PO TABS: 10.0000 mg | ORAL_TABLET | Freq: Every day | ORAL | 1 refills | Status: DC

## 2023-08-17 MED ORDER — AZELASTINE HCL 0.15 % NA SOLN: 1.0000 | Freq: Two times a day (BID) | NASAL | 1 refills | Status: DC

## 2023-08-17 NOTE — Progress Notes (Signed)
FOLLOW UP  Date of Service/Encounter:  08/17/23   Assessment:   easonal and perennial allergic rhinitis (grasses, ragweed, weeds, indoor molds, outdoor molds, dust mites, and cockroach)   Flexural atopic dermatitis   GERD - controlled with Dexilant  Plan/Recommendations:   1. Seasonal and perennial allergic rhinitis - Previous testing showed: grasses, ragweed, weeds, indoor molds, outdoor molds, dust mites, and cockroach. - Avoidance measures provided. - Continue with: Zyrtec (cetirizine) 10mg  tablet once daily - Continue with: fluticasone one spray per nostril twice daily and azelastine one spray per nostril twice daily - You can use an extra dose of the antihistamine, if needed, for breakthrough symptoms.  - Consider nasal saline rinses 1-2 times daily to remove allergens from the nasal cavities as well as help with mucous clearance (this is especially helpful to do before the nasal sprays are given) - We can hold off on allergy shots at this point in time.  - I will write a letter to the [employer] to see if they can remove the carpeting.   2. Flexural atopic dermatitis - Skin looks fairly good. - Call us if you need refills of creams.   3. Return in about 1 year (around 08/16/2024).    Subjective:   Helen Hess is a 67 y.o. female presenting today for follow up of  Chief Complaint  Patient presents with   Follow-up   Pruritus    States carpet in home is causing the itching.    Helen Hess has a history of the following: Patient Active Problem List   Diagnosis Date Noted   Allergic rhinitis 02/05/2022   Allergic rhinitis due to pollen 02/05/2022   Aortic arch atherosclerosis ([facility]) 02/05/2022   Chronic kidney disease, stage 2 (mild) 02/05/2022   Chronic pain syndrome 02/05/2022   Degeneration of lumbar intervertebral disc 02/05/2022   Deep vein thrombosis (DVT) of both lower extremities ([facility]) 02/05/2022   Family history of malignant neoplasm of  digestive organs 02/05/2022   Gastroesophageal reflux disease 02/05/2022   Gout 02/05/2022   History of squamous cell carcinoma of skin 02/05/2022   Long term (current) use of anticoagulants 02/05/2022   Major depression in partial remission ([facility]) 02/05/2022   Memory deficit 02/05/2022   Moderate recurrent major depression ([facility]) 02/05/2022   Obstructive sleep apnea syndrome 02/05/2022   Osteoarthritis 02/05/2022   Parageusia 02/05/2022   Pure hypercholesterolemia 02/05/2022   HYPERTENSION 04/25/2009   LOW BACK PAIN, ACUTE 04/25/2009   DVT, HX OF 04/25/2009    History obtained from: chart review and patient.  Helen Hess is a 67 y.o. female presenting for a follow up visit. She was last seen in January 2024. At that time, we continued with cetirizine 10mg  daily and started fluticasone and azelastine. Atopic dermatitis was under good control with the topical steroid as needed.   Since the last visit, she has done well.   Allergic Rhinitis Symptom History: She is still having allergic rhinitis symptoms. She is not having any improvement, although the nasal sprays do help when she uses them.  She is having some postnasal drip and runny nose. The nose sprays help. She is having reactions when she is in her apartment. This apartment is managed by the [employer]. She has been there for over ten years.   Skin Symptom History: She has carpeting in her apartment. She reports that this is terrible, although she does vacuum. She has itching a lot.  She is doing everything that she can  to keep her allergens under control. She is wondering whether we can contact her apartment managers to see if something can be done regarding her flooring. She lives in [address] which is managed by the [employer].   Otherwise, there have been no changes to her past medical history, surgical history, family history, or social history.    Review of systems otherwise negative other  than that mentioned in the HPI.    Objective:   Blood pressure (!) 138/90, pulse 64, temperature 98.9 F (37.2 C), height 5\' 6"  (1.676 m), weight 212 lb 3.2 oz (96.3 kg), SpO2 99%. Body mass index is 34.25 kg/m.    Physical Exam Vitals reviewed.  Constitutional:      Appearance: She is well-developed.     Comments: Pleasant female.  Cooperative with the exam.  HENT:     Head: Normocephalic and atraumatic.     Right Ear: Tympanic membrane, ear canal and external ear normal. No drainage, swelling or tenderness. Tympanic membrane is not injected, scarred, erythematous, retracted or bulging.     Left Ear: Tympanic membrane, ear canal and external ear normal. No drainage, swelling or tenderness. Tympanic membrane is not injected, scarred, erythematous, retracted or bulging.     Nose: No nasal deformity, septal deviation, mucosal edema or rhinorrhea.     Right Turbinates: Enlarged, swollen and pale.     Left Turbinates: Enlarged, swollen and pale.     Right Sinus: No maxillary sinus tenderness or frontal sinus tenderness.     Left Sinus: No maxillary sinus tenderness or frontal sinus tenderness.     Comments: No polyps.    Mouth/Throat:     Mouth: Mucous membranes are not pale and not dry.     Pharynx: Uvula midline.  Eyes:     General:        Right eye: No discharge.        Left eye: No discharge.     Conjunctiva/sclera: Conjunctivae normal.     Right eye: Right conjunctiva is not injected. No chemosis.    Left eye: Left conjunctiva is not injected. No chemosis.    Pupils: Pupils are equal, round, and reactive to light.  Cardiovascular:     Rate and Rhythm: Normal rate and regular rhythm.     Heart sounds: Normal heart sounds.  Pulmonary:     Effort: Pulmonary effort is normal. No tachypnea, accessory muscle usage or respiratory distress.     Breath sounds: Normal breath sounds. No wheezing, rhonchi or rales.  Chest:     Chest wall: No tenderness.  Lymphadenopathy:      Head:     Right side of head: No submandibular, tonsillar or occipital adenopathy.     Left side of head: No submandibular, tonsillar or occipital adenopathy.     Cervical: No cervical adenopathy.  Skin:    General: Skin is warm.     Capillary Refill: Capillary refill takes less than 2 seconds.     Coloration: Skin is not pale.     Findings: No abrasion, erythema, petechiae or rash. Rash is not papular, urticarial or vesicular.     Comments: She does have some excoriations present.   Neurological:     Mental Status: She is alert.  Psychiatric:        Behavior: Behavior is cooperative.      Diagnostic studies: none      Malachi Bonds, MD  [facility]

## 2023-08-17 NOTE — Patient Instructions (Addendum)
1. Seasonal and perennial allergic rhinitis - Previous testing showed: grasses, ragweed, weeds, indoor molds, outdoor molds, dust mites, and cockroach. - Avoidance measures provided. - Continue with: Zyrtec (cetirizine) 10mg  tablet once daily - Continue with: fluticasone one spray per nostril twice daily and azelastine one spray per nostril twice daily - You can use an extra dose of the antihistamine, if needed, for breakthrough symptoms.  - Consider nasal saline rinses 1-2 times daily to remove allergens from the nasal cavities as well as help with mucous clearance (this is especially helpful to do before the nasal sprays are given) - We can hold off on allergy shots at this point in time.  - I will write a letter to the [employer] to see if they can remove the carpeting.   2. Flexural atopic dermatitis - Skin looks fairly good. - Call us if you need refills of creams.   3. Return in about 1 year (around 08/16/2024).    Please inform us of any Emergency Department visits, hospitalizations, or changes in symptoms. Call us before going to the ED for breathing or allergy symptoms since we might be able to fit you in for a sick visit. Feel free to contact us anytime with any questions, problems, or concerns.  It was a pleasure to see you today!  Websites that have reliable patient information: 1. American Academy of Asthma, Allergy, and Immunology: www.aaaai.org 2. Food Allergy Research and Education (FARE): foodallergy.org 3. Mothers of Asthmatics: http://www.asthmacommunitynetwork.org 4. American College of Allergy, Asthma, and Immunology: www.acaai.org   COVID-19 Vaccine Information can be found at: [email or web address] For questions related to vaccine distribution or appointments, please email [email or web address] or call [phone].   We realize that you might be concerned about having an allergic reaction to the  COVID19 vaccines. To help with that concern, WE ARE OFFERING THE COVID19 VACCINES IN OUR OFFICE! Ask the front desk for dates!     "Like" [place] on Facebook and Instagram for our latest updates!      A healthy democracy works best when [employer] participate! Make sure you are registered to vote! If you have moved or changed any of your contact information, you will need to get this updated before voting!  In some cases, you MAY be able to register to vote online: [email or web address]

## 2023-08-18 ENCOUNTER — Encounter: Payer: Self-pay | Admitting: Allergy & Immunology

## 2023-08-24 ENCOUNTER — Encounter: Payer: Self-pay | Admitting: Allergy & Immunology

## 2023-08-24 NOTE — Progress Notes (Signed)
Letter mailed and sent to my chart

## 2023-09-07 ENCOUNTER — Telehealth: Payer: Self-pay

## 2023-09-07 NOTE — Telephone Encounter (Signed)
Received faxed [employer] Form from patient - DOB verified.  Form has been partially completed - placed in provider's in basket to review/complete/sign.  Forwarding message to provider as update.

## 2023-09-09 NOTE — Telephone Encounter (Signed)
Kimberlynn called back and stated she needs for Dr. Dellis Anes to put specifically what type of flooring to use.Helen Hess states it needs to say hardwood flooring, laminate, or whatever type flooring he thinks is best.  Amran states it has to specific or they wont do it for her.

## 2023-09-09 NOTE — Telephone Encounter (Signed)
Patient came in today requesting forms be filled out. The patient was advised that we have received the forms and it is waiting on a provider signature. Patient does not want to drive back here in bad weather to pick the forms up the patient would like for [place] to mail it to her. Patient also requested to be called once the forms are completed.

## 2023-09-10 NOTE — Telephone Encounter (Signed)
Called and attempted to leave a voice mail but patients mailbox was full. Wanted to inform patient that the letter would be ready for pick up in the [place] office next week. I will also mail a copy to her home address.

## 2023-09-10 NOTE — Telephone Encounter (Signed)
I would go with whatever is cheapest - just nothing like carpeting that traps dust and other allergens.

## 2023-09-10 NOTE — Telephone Encounter (Signed)
Lm for pt to call us back about this °

## 2023-09-13 NOTE — Telephone Encounter (Signed)
Patient called back. Advised her of [place]'s message. Patient verbalized understanding.

## 2023-09-14 DIAGNOSIS — G4733 Obstructive sleep apnea (adult) (pediatric): Secondary | ICD-10-CM | POA: Diagnosis not present

## 2023-09-22 DIAGNOSIS — M47817 Spondylosis without myelopathy or radiculopathy, lumbosacral region: Secondary | ICD-10-CM | POA: Diagnosis not present

## 2023-09-22 DIAGNOSIS — G4700 Insomnia, unspecified: Secondary | ICD-10-CM | POA: Diagnosis not present

## 2023-09-22 DIAGNOSIS — G894 Chronic pain syndrome: Secondary | ICD-10-CM | POA: Diagnosis not present

## 2023-10-20 ENCOUNTER — Other Ambulatory Visit: Payer: Self-pay | Admitting: Internal Medicine

## 2023-10-20 DIAGNOSIS — Z0000 Encounter for general adult medical examination without abnormal findings: Secondary | ICD-10-CM

## 2023-10-21 ENCOUNTER — Ambulatory Visit
Admission: RE | Admit: 2023-10-21 | Discharge: 2023-10-21 | Disposition: A | Discharge status: Home / Self Care | Payer: Medicaid Other | Source: Ambulatory Visit | Attending: Internal Medicine | Admitting: Internal Medicine

## 2023-10-21 DIAGNOSIS — Z0000 Encounter for general adult medical examination without abnormal findings: Secondary | ICD-10-CM

## 2023-10-29 DIAGNOSIS — G4733 Obstructive sleep apnea (adult) (pediatric): Secondary | ICD-10-CM | POA: Diagnosis not present

## 2023-11-23 DIAGNOSIS — I10 Essential (primary) hypertension: Secondary | ICD-10-CM | POA: Diagnosis not present

## 2023-11-23 DIAGNOSIS — G4733 Obstructive sleep apnea (adult) (pediatric): Secondary | ICD-10-CM | POA: Diagnosis not present

## 2023-11-23 DIAGNOSIS — N182 Chronic kidney disease, stage 2 (mild): Secondary | ICD-10-CM | POA: Diagnosis not present

## 2023-11-23 DIAGNOSIS — D6869 Other thrombophilia: Secondary | ICD-10-CM | POA: Diagnosis not present

## 2023-12-22 DIAGNOSIS — M47817 Spondylosis without myelopathy or radiculopathy, lumbosacral region: Secondary | ICD-10-CM | POA: Diagnosis not present

## 2023-12-22 DIAGNOSIS — G4700 Insomnia, unspecified: Secondary | ICD-10-CM | POA: Diagnosis not present

## 2023-12-22 DIAGNOSIS — G894 Chronic pain syndrome: Secondary | ICD-10-CM | POA: Diagnosis not present

## 2024-01-15 DIAGNOSIS — G4733 Obstructive sleep apnea (adult) (pediatric): Secondary | ICD-10-CM | POA: Diagnosis not present

## 2024-02-21 DIAGNOSIS — G4733 Obstructive sleep apnea (adult) (pediatric): Secondary | ICD-10-CM | POA: Diagnosis not present

## 2024-02-22 DIAGNOSIS — L299 Pruritus, unspecified: Secondary | ICD-10-CM | POA: Diagnosis not present

## 2024-02-24 DIAGNOSIS — I10 Essential (primary) hypertension: Secondary | ICD-10-CM | POA: Diagnosis not present

## 2024-02-24 DIAGNOSIS — R0789 Other chest pain: Secondary | ICD-10-CM | POA: Diagnosis not present

## 2024-03-14 DIAGNOSIS — H524 Presbyopia: Secondary | ICD-10-CM | POA: Diagnosis not present

## 2024-03-14 DIAGNOSIS — H2511 Age-related nuclear cataract, right eye: Secondary | ICD-10-CM | POA: Diagnosis not present

## 2024-04-21 ENCOUNTER — Ambulatory Visit (INDEPENDENT_AMBULATORY_CARE_PROVIDER_SITE_OTHER): Admitting: Pain Medicine

## 2024-04-21 VITALS — BP 159/90 | Ht 66.0 in | Wt 213.0 lb

## 2024-04-21 DIAGNOSIS — G8929 Other chronic pain: Secondary | ICD-10-CM

## 2024-04-21 DIAGNOSIS — M7542 Impingement syndrome of left shoulder: Secondary | ICD-10-CM

## 2024-04-21 DIAGNOSIS — M545 Low back pain, unspecified: Secondary | ICD-10-CM | POA: Diagnosis not present

## 2024-04-21 DIAGNOSIS — M47817 Spondylosis without myelopathy or radiculopathy, lumbosacral region: Secondary | ICD-10-CM

## 2024-04-21 DIAGNOSIS — M25512 Pain in left shoulder: Secondary | ICD-10-CM

## 2024-04-21 NOTE — Progress Notes (Signed)
 DATE OF VISIT: 04/21/2024        Helen Hess DOB: Sep 12, 1956 MRN: [id number]  CC:  Low back pain and L shoulder pain  History- Helen Hess is a 68 y.o. female in clinic for evaluation and treatment of low back pain and L shoulder pain.  Ms. Veverka reports an injury in 2014 for both her low back and shoulder that involved twisting her low back and grabbing an item for her shoulder.  Since then, she has had chronic pain in those areas, along with other complaints in her knees, hips, mid back primarily.    She denies h/o diabetes, though she is on xarelto.  She is not actively on opioid medications.  She had a prior injection in her Left shoulder, and she would like to avoid future injections at this time.  Has attempted voltaren gel in past with no  benefit.  Also reports last PT was in 2014.  Denies bowel/bladder issues.  Denies wishing to pursue surgical options at this time as well.  Pain complaint: Location: Low back Severity: 7.5/10 Description: Aching, burning, sharp Timing: Constant Radiation: Bilateral legs without dermatomal distribution to ankles.  Primarily lateral Aggravating factors: weather changes, activity Alleviating factors: Medications (buprenorphine patch, norco) Attribution: Injury as above  Location: L Shoulder Severity: 7.5/10 Description: Aching, burning, sharp Timing: Constant Radiation: Some radiation up to neck on left, to lateral arm Aggravating factors: weather changes, activity Alleviating factors: Medications (buprenorphine patch, norco) Attribution: Injury as above  Past Medical History Past Medical History:  Diagnosis Date   Arthritis    Cancer (HCC)    skin cancers areas removed   Depression    GERD (gastroesophageal reflux disease)    H/O blood clots    Hyperlipemia    Hypertension    Sleep apnea    uses cpap setting of 6    Past Surgical History Past Surgical History:  Procedure Laterality Date   BREAST BIOPSY Right     CHOLECYSTECTOMY     COLONOSCOPY WITH PROPOFOL  N/A 03/08/2017   Procedure: COLONOSCOPY WITH PROPOFOL ;  Surgeon: Garrett Kallman, MD;  Location: [facility];  Service: Endoscopy;  Laterality: N/A;   PARTIAL HYSTERECTOMY     TONSILLECTOMY     child   TUBAL LIGATION      Medications Current Outpatient Medications  Medication Sig Dispense Refill   amLODipine (NORVASC) 5 MG tablet Take 5 mg by mouth daily.      Azelastine  HCl 0.15 % SOLN Place 1 spray into both nostrils 2 (two) times daily. 90 mL 1   betamethasone  dipropionate 0.05 % cream Apply topically 2 (two) times daily. 30 g 3   buprenorphine (BUTRANS) 5 MCG/HR PTWK Place 1 patch onto the skin once a week.     buPROPion  (WELLBUTRIN  XL) 300 MG 24 hr tablet Take 1 tablet (300 mg total) by mouth every morning. 30 tablet 2   cetirizine  (ZYRTEC ) 10 MG tablet Take 1 tablet (10 mg total) by mouth daily. 90 tablet 1   citalopram  (CELEXA ) 20 MG tablet Take 1 tablet (20 mg total) by mouth daily. 30 tablet 2   cyclobenzaprine (FLEXERIL) 10 MG tablet Take 10 mg by mouth 3 (three) times daily as needed for muscle spasms.      dexlansoprazole (DEXILANT) 60 MG capsule Take 60 mg by mouth daily.     fluocinonide cream (LIDEX) 0.05 % Apply 1 application topically 2 (two) times daily as needed (skin irritation).   2   fluticasone  (FLONASE )  50 MCG/ACT nasal spray Place 1 spray into both nostrils in the morning and at bedtime. 48 g 1   HYDROcodone-acetaminophen  (NORCO/VICODIN) 5-325 MG per tablet Take 1 tablet by mouth every 6 (six) hours as needed for moderate pain.      methocarbamol  (ROBAXIN ) 500 MG tablet Take 1 tablet (500 mg total) by mouth 2 (two) times daily. 20 tablet 0   metoprolol tartrate (LOPRESSOR) 25 MG tablet Take 25 mg by mouth daily.     montelukast  (SINGULAIR ) 10 MG tablet TAKE 1 TABLET(10 MG) BY MOUTH AT BEDTIME 30 tablet 2   nystatin (MYCOSTATIN) 100000 UNIT/ML suspension Take 5 mLs by mouth 2 (two) times daily.     olmesartan (BENICAR)  40 MG tablet Take 40 mg by mouth daily.     PRESCRIPTION MEDICATION Pt has CPAP machine     rivaroxaban (XARELTO) 20 MG TABS tablet Take 20 mg by mouth daily with supper.     simvastatin (ZOCOR) 20 MG tablet Take 20 mg by mouth every evening.      valsartan-hydrochlorothiazide (DIOVAN-HCT) 160-12.5 MG per tablet Take 1 tablet by mouth daily.      No current facility-administered medications for this visit.    Allergies is allergic to amlodipine besylate, atorvastatin, dust mite extract, other, rosuvastatin, and tramadol.  Family History - reviewed per EMR and intake form  Social History   reports no history of alcohol use.  reports that she has never smoked. She has never been exposed to tobacco smoke. She has never used smokeless tobacco.  reports no history of drug use.  EXAM: Vitals: BP (!) 159/90   Ht 5\' 6"  (1.676 m)   Wt 213 lb (96.6 kg)   BMI 34.38 kg/m  General: AOx3, NAD, pleasant SKIN: no rashes or lesions, skin clean, dry, intact MSK: Low back/Spine: TTP over lower back L4/S1 area.  Worse on paraspinal palpation than midline, equal bilaterally.  Some tenderness over PSIS.   SLR negative for leg pain bilaterally. FABER positive for ipsilateral groin pain bilaterally Pain on palpation of bilateral GT  L shoulder: TTP over rotator musculature.  Positive Hawkins sign.  Positive Neer.  TTP over bicipital tendon.  NEURO: sensation intact to light touch.  Patchy changes without dermatomal distribution bilaterally. 4/5 strength bilaterally with diminished effort.   4/5 strength L shoulder abduction, external/internal rotator.  IMAGING: MRI 2020  Lumbar spine - Lumbar spondylosis most notable at L4-5 where there is moderate multifactorial spinal stenosis and right greater than left lateral recess stenosis.  ASSESSMENT:   ICD-10-CM   1. Chronic left shoulder pain  M25.512 DG Shoulder Left   G89.29 Ambulatory referral to Physical Therapy    2. Chronic low back pain,  unspecified back pain laterality, unspecified whether sciatica present  M54.50 Ambulatory referral to Physical Therapy   G89.29     3. Lumbosacral spondylosis without myelopathy  M47.817     4. Shoulder impingement syndrome, left  M75.42        PLAN: 1. Low back pain - Appears facetogenic in nature primarily.  No radicular signs on exam today.  Prior MRI showing spondylosis and R>L lateral recess stenosis.  Recommended Medial branch blocks for spondylosis, but patient declined.   2. L Shoulder - Appears to be deconditioning with possible impingement.  She declined injection for pain.  Will order Xray as no imaging in system at this time. 3. Deconditioning - PT ordered for low back and shoulder pain.  Has not done PT since 2014 injury.  4. Medications  - Recommended gabapentin but patient declined due to potential side effects.  - Do not recommend opioid continuation for chronic back and shoulder pain at this time.    - Recommend PCP consider switching celexa  to cymbalta for pain and depression treatment.  Will defer to PCP given multiple medications for mood being prescribed.   The patient will return to see me in 8 weeks, will call sooner with any questions/concerns.   Patient expressed understanding & agreement with above.

## 2024-05-03 DIAGNOSIS — I10 Essential (primary) hypertension: Secondary | ICD-10-CM | POA: Diagnosis not present

## 2024-05-03 DIAGNOSIS — R252 Cramp and spasm: Secondary | ICD-10-CM | POA: Diagnosis not present

## 2024-05-03 DIAGNOSIS — G894 Chronic pain syndrome: Secondary | ICD-10-CM | POA: Diagnosis not present

## 2024-05-03 DIAGNOSIS — K219 Gastro-esophageal reflux disease without esophagitis: Secondary | ICD-10-CM | POA: Diagnosis not present

## 2024-05-03 DIAGNOSIS — I82403 Acute embolism and thrombosis of unspecified deep veins of lower extremity, bilateral: Secondary | ICD-10-CM | POA: Diagnosis not present

## 2024-05-03 DIAGNOSIS — N1831 Chronic kidney disease, stage 3a: Secondary | ICD-10-CM | POA: Diagnosis not present

## 2024-05-11 DIAGNOSIS — N644 Mastodynia: Secondary | ICD-10-CM | POA: Diagnosis not present

## 2024-05-21 DIAGNOSIS — G4733 Obstructive sleep apnea (adult) (pediatric): Secondary | ICD-10-CM | POA: Diagnosis not present

## 2024-06-15 DIAGNOSIS — I10 Essential (primary) hypertension: Secondary | ICD-10-CM | POA: Diagnosis not present

## 2024-06-15 DIAGNOSIS — I499 Cardiac arrhythmia, unspecified: Secondary | ICD-10-CM | POA: Diagnosis not present

## 2024-06-15 DIAGNOSIS — I491 Atrial premature depolarization: Secondary | ICD-10-CM | POA: Diagnosis not present

## 2024-07-04 ENCOUNTER — Ambulatory Visit: Admitting: Physical Therapy

## 2024-07-07 ENCOUNTER — Encounter: Payer: Self-pay | Admitting: Pain Medicine

## 2024-07-07 ENCOUNTER — Ambulatory Visit
Admission: RE | Admit: 2024-07-07 | Discharge: 2024-07-07 | Disposition: A | Discharge status: Home / Self Care | Source: Ambulatory Visit | Attending: Pain Medicine | Admitting: Pain Medicine

## 2024-07-07 ENCOUNTER — Ambulatory Visit (INDEPENDENT_AMBULATORY_CARE_PROVIDER_SITE_OTHER): Admitting: Pain Medicine

## 2024-07-07 VITALS — BP 114/80 | Ht 66.0 in | Wt 213.0 lb

## 2024-07-07 DIAGNOSIS — M47817 Spondylosis without myelopathy or radiculopathy, lumbosacral region: Secondary | ICD-10-CM | POA: Diagnosis not present

## 2024-07-07 DIAGNOSIS — M25512 Pain in left shoulder: Secondary | ICD-10-CM | POA: Diagnosis not present

## 2024-07-07 DIAGNOSIS — M7542 Impingement syndrome of left shoulder: Secondary | ICD-10-CM | POA: Diagnosis not present

## 2024-07-07 DIAGNOSIS — M545 Low back pain, unspecified: Secondary | ICD-10-CM | POA: Diagnosis not present

## 2024-07-07 DIAGNOSIS — G8929 Other chronic pain: Secondary | ICD-10-CM

## 2024-07-07 MED ORDER — GABAPENTIN 300 MG PO CAPS: ORAL_CAPSULE | ORAL | 3 refills | Status: DC

## 2024-07-07 NOTE — Patient Instructions (Addendum)
 Get your shoulder x ray at [facility] [address] [place]  Ph: [phone]

## 2024-07-07 NOTE — Progress Notes (Signed)
 DATE OF VISIT: 07/07/2024        Helen Hess DOB: [birth year] MRN: [id number]  CC:  Low back pain and L shoulder pain  History- Helen Hess is a 68 y.o. female in clinic for evaluation and treatment of low back pain and L shoulder pain.  Helen Hess reports an injury in 2014 for both her low back and shoulder that involved twisting her low back and grabbing an item for her shoulder.  Since then, she has had chronic pain in those areas, along with other complaints in her knees, hips, mid back primarily.    She denies h/o diabetes, though she is on xarelto.  She is not actively on opioid medications.  She had a prior injection in her Left shoulder, and she would like to avoid future injections at this time.  Has attempted voltaren gel in past with no  benefit.  Also reports last PT was in 2014.  Denies bowel/bladder issues.  Denies wishing to pursue surgical options at this time as well..  Pain complaint: Location: Low back Severity: 7.5/10 Description: Aching, burning, sharp Timing: Constant Radiation: Bilateral legs without dermatomal distribution to ankles.  Primarily lateral Aggravating factors: weather changes, activity Alleviating factors: Medications (buprenorphine patch, norco) Attribution: Injury as above  Location: L Shoulder Severity: 7.5/10 Description: Aching, burning, sharp Timing: Constant Radiation: Some radiation up to neck on left, to lateral arm Aggravating factors: weather changes, activity Alleviating factors: Medications (buprenorphine patch, norco) Attribution: Injury as above  Interval history:  Patient still describing pain in back, though c/o pain throughout entire back from neck to sacral area.  Has not participated in PT - had first appointment on Monday but did not attend due to pain.  States tylenol  is not helping with her pain, and is also taking Flexeril once daily.  Xray of shoulder was not obtained since last appointment.    Past Medical  History Past Medical History:  Diagnosis Date   Arthritis    Cancer (HCC)    skin cancers areas removed   Depression    GERD (gastroesophageal reflux disease)    H/O blood clots    Hyperlipemia    Hypertension    Sleep apnea    uses cpap setting of 6    Past Surgical History Past Surgical History:  Procedure Laterality Date   BREAST BIOPSY Right    CHOLECYSTECTOMY     COLONOSCOPY WITH PROPOFOL  N/A 03/08/2017   Procedure: COLONOSCOPY WITH PROPOFOL ;  Surgeon: Gladis MARLA Louder, MD;  Location: [facility];  Service: Endoscopy;  Laterality: N/A;   PARTIAL HYSTERECTOMY     TONSILLECTOMY     child   TUBAL LIGATION      Medications Current Outpatient Medications  Medication Sig Dispense Refill   gabapentin (NEURONTIN) 300 MG capsule Take one tab by mouth every morning for 3 days, then one tab every morning and afternoon for 3 days, then one tab three times a day.  Stay at this final dose. 90 capsule 3   amLODipine (NORVASC) 5 MG tablet Take 5 mg by mouth daily.      Azelastine  HCl 0.15 % SOLN Place 1 spray into both nostrils 2 (two) times daily. 90 mL 1   betamethasone  dipropionate 0.05 % cream Apply topically 2 (two) times daily. 30 g 3   buprenorphine (BUTRANS) 5 MCG/HR PTWK Place 1 patch onto the skin once a week.     buPROPion  (WELLBUTRIN  XL) 300 MG 24 hr tablet Take 1 tablet (300 mg  total) by mouth every morning. 30 tablet 2   cetirizine  (ZYRTEC ) 10 MG tablet Take 1 tablet (10 mg total) by mouth daily. 90 tablet 1   citalopram  (CELEXA ) 20 MG tablet Take 1 tablet (20 mg total) by mouth daily. 30 tablet 2   cyclobenzaprine (FLEXERIL) 10 MG tablet Take 10 mg by mouth 3 (three) times daily as needed for muscle spasms.      dexlansoprazole (DEXILANT) 60 MG capsule Take 60 mg by mouth daily.     fluocinonide cream (LIDEX) 0.05 % Apply 1 application topically 2 (two) times daily as needed (skin irritation).   2   fluticasone  (FLONASE ) 50 MCG/ACT nasal spray Place 1 spray into both  nostrils in the morning and at bedtime. 48 g 1   HYDROcodone-acetaminophen  (NORCO/VICODIN) 5-325 MG per tablet Take 1 tablet by mouth every 6 (six) hours as needed for moderate pain.      methocarbamol  (ROBAXIN ) 500 MG tablet Take 1 tablet (500 mg total) by mouth 2 (two) times daily. 20 tablet 0   metoprolol tartrate (LOPRESSOR) 25 MG tablet Take 25 mg by mouth daily.     montelukast  (SINGULAIR ) 10 MG tablet TAKE 1 TABLET(10 MG) BY MOUTH AT BEDTIME 30 tablet 2   nystatin (MYCOSTATIN) 100000 UNIT/ML suspension Take 5 mLs by mouth 2 (two) times daily.     olmesartan (BENICAR) 40 MG tablet Take 40 mg by mouth daily.     PRESCRIPTION MEDICATION Pt has CPAP machine     rivaroxaban (XARELTO) 20 MG TABS tablet Take 20 mg by mouth daily with supper.     simvastatin (ZOCOR) 20 MG tablet Take 20 mg by mouth every evening.      valsartan-hydrochlorothiazide (DIOVAN-HCT) 160-12.5 MG per tablet Take 1 tablet by mouth daily.      No current facility-administered medications for this visit.    Allergies is allergic to amlodipine besylate, atorvastatin, dust mite extract, other, rosuvastatin, and tramadol.  Family History - reviewed per EMR and intake form  Social History   reports no history of alcohol use.  reports that she has never smoked. She has never been exposed to tobacco smoke. She has never used smokeless tobacco.  reports no history of drug use.  EXAM: TTP over back musculature from scapular area to lumbosacral junction bilaterally.  Increased tenderness over b/l lumbar facets near L5/S1.   ASSESSMENT:   ICD-10-CM   1. Chronic left shoulder pain  M25.512    G89.29     2. Chronic low back pain, unspecified back pain laterality, unspecified whether sciatica present  M54.50    G89.29     3. Lumbosacral spondylosis without myelopathy  M47.817     4. Shoulder impingement syndrome, left  M75.42        PLAN: 1. Low back pain - Appears facetogenic in nature primarily.  Prior MRI  showing spondylosis and R>L lateral recess stenosis.  Recommended Medial branch blocks for spondylosis, but patient declined.   2. L Shoulder - Appears to be deconditioning with possible impingement.  She declined injection for pain.  Encouraged patient to obtain Xray as no imaging in system at this time. 3. Deconditioning - Encouraged attending PT for back and shoulder pain.  Has not done PT since 2014 injury.  Explained that this is likely to yield the greatest benefit for her at this time. 4. Medications  - Will start gabapentin with slow titration to 300mg  TID.    - Do not recommend opioid continuation for chronic back  and shoulder pain at this time.    - Recommend PCP consider switching celexa  to cymbalta for pain and depression treatment.  Will defer to PCP given multiple medications for mood being prescribed.  The patient will return to see me in 2-3 months, will call sooner with any questions/concerns.   Patient expressed understanding & agreement with above.

## 2024-07-10 ENCOUNTER — Other Ambulatory Visit: Payer: Self-pay

## 2024-07-10 MED ORDER — GABAPENTIN 300 MG PO CAPS: ORAL_CAPSULE | ORAL | 3 refills | Status: AC

## 2024-07-10 NOTE — Progress Notes (Signed)
 Pt called stating Rx should have gone to [facility] instead of [facility]. Rx sent to correct pharmacy.

## 2024-07-14 DIAGNOSIS — G4733 Obstructive sleep apnea (adult) (pediatric): Secondary | ICD-10-CM | POA: Diagnosis not present

## 2024-08-09 ENCOUNTER — Encounter (HOSPITAL_COMMUNITY): Payer: Self-pay

## 2024-08-09 ENCOUNTER — Ambulatory Visit (HOSPITAL_COMMUNITY)
Admission: EM | Admit: 2024-08-09 | Discharge: 2024-08-09 | Disposition: A | Discharge status: Home / Self Care | Attending: Nurse Practitioner | Admitting: Nurse Practitioner

## 2024-08-09 ENCOUNTER — Ambulatory Visit (INDEPENDENT_AMBULATORY_CARE_PROVIDER_SITE_OTHER)

## 2024-08-09 DIAGNOSIS — M79672 Pain in left foot: Secondary | ICD-10-CM | POA: Diagnosis not present

## 2024-08-09 DIAGNOSIS — S92412A Displaced fracture of proximal phalanx of left great toe, initial encounter for closed fracture: Secondary | ICD-10-CM

## 2024-08-09 DIAGNOSIS — S92515A Nondisplaced fracture of proximal phalanx of left lesser toe(s), initial encounter for closed fracture: Secondary | ICD-10-CM | POA: Diagnosis not present

## 2024-08-09 DIAGNOSIS — S90452A Superficial foreign body, left great toe, initial encounter: Secondary | ICD-10-CM | POA: Diagnosis not present

## 2024-08-09 DIAGNOSIS — M19072 Primary osteoarthritis, left ankle and foot: Secondary | ICD-10-CM | POA: Diagnosis not present

## 2024-08-09 MED ORDER — ACETAMINOPHEN 325 MG PO TABS
ORAL_TABLET | ORAL | Status: AC
  Filled 2024-08-09: qty 3

## 2024-08-09 MED ORDER — ACETAMINOPHEN 325 MG PO TABS
975.0000 mg | ORAL_TABLET | Freq: Once | ORAL | Status: AC
  Administered 2024-08-09: 975 mg via ORAL

## 2024-08-09 NOTE — ED Triage Notes (Signed)
 Patient presents to the office for Left great toe pain. Patient states she drop a pot on her big toe today. Patient states the toe is numb.

## 2024-08-09 NOTE — ED Provider Notes (Signed)
 [facility]    CSN: [id number] Arrival date & time: 08/09/24  1424      History   Chief Complaint No chief complaint on file.   HPI Helen Hess is a 69 y.o. female.   Discussed the use of AI scribe software for clinical note transcription with the patient, who gave verbal consent to proceed.   Patient presents with left toe pain and swelling after dropping a heavy glass bowl on her foot at approximately 12:00 PM today. The patient reports the bowl weighed about 5 pounds and struck across the top of her toe. She describes intermittent numbness in the affected area, stating it would be numb for a minute and then it's gone. The patient notes difficulty wiggling her toe due to pain and observes swelling extending from the toe down into her foot. She has not taken any pain medication prior to her visit or tried anything for her symptoms. She denies a history of diabetes or neuropathy in her feet.    The following portions of the patient's history were reviewed and updated as appropriate: allergies, current medications, past family history, past medical history, past social history, past surgical history, and problem list.    Past Medical History:  Diagnosis Date   Arthritis    Cancer (HCC)    skin cancers areas removed   Depression    GERD (gastroesophageal reflux disease)    H/O blood clots    Hyperlipemia    Hypertension    Sleep apnea    uses cpap setting of 6    Patient Active Problem List   Diagnosis Date Noted   Allergic rhinitis 02/05/2022   Allergic rhinitis due to pollen 02/05/2022   Aortic arch atherosclerosis (HCC) 02/05/2022   Chronic kidney disease, stage 2 (mild) 02/05/2022   Chronic pain syndrome 02/05/2022   Degeneration of lumbar intervertebral disc 02/05/2022   Deep vein thrombosis (DVT) of both lower extremities (HCC) 02/05/2022   Family history of malignant neoplasm of digestive organs 02/05/2022   Gastroesophageal reflux disease  02/05/2022   Gout 02/05/2022   History of squamous cell carcinoma of skin 02/05/2022   Long term (current) use of anticoagulants 02/05/2022   Major depression in partial remission (HCC) 02/05/2022   Memory deficit 02/05/2022   Moderate recurrent major depression (HCC) 02/05/2022   Obstructive sleep apnea syndrome 02/05/2022   Osteoarthritis 02/05/2022   Parageusia 02/05/2022   Pure hypercholesterolemia 02/05/2022   Essential hypertension 04/25/2009   LOW BACK PAIN, ACUTE 04/25/2009   DVT, HX OF 04/25/2009    Past Surgical History:  Procedure Laterality Date   BREAST BIOPSY Right    CHOLECYSTECTOMY     COLONOSCOPY WITH PROPOFOL  N/A 03/08/2017   Procedure: COLONOSCOPY WITH PROPOFOL ;  Surgeon: Gladis MARLA Louder, MD;  Location: [facility];  Service: Endoscopy;  Laterality: N/A;   PARTIAL HYSTERECTOMY     TONSILLECTOMY     child   TUBAL LIGATION      OB History   No obstetric history on file.      Home Medications    Prior to Admission medications   Medication Sig Start Date End Date Taking? Authorizing Provider  amLODipine (NORVASC) 5 MG tablet Take 5 mg by mouth daily.  02/21/15  Yes [provider]  cetirizine  (ZYRTEC ) 10 MG tablet Take 1 tablet (10 mg total) by mouth daily. 08/17/23  Yes Iva Marty Saltness, MD  citalopram  (CELEXA ) 20 MG tablet Take 1 tablet (20 mg total) by mouth daily. 07/05/15  Yes Arfeen, Leni DASEN, MD  cyclobenzaprine (FLEXERIL) 10 MG tablet Take 10 mg by mouth 3 (three) times daily as needed for muscle spasms.  02/21/15  Yes [provider]  gabapentin  (NEURONTIN ) 300 MG capsule Take one tab by mouth every morning for 3 days, then one tab every morning and afternoon for 3 days, then one tab three times a day.  Stay at this final dose. 07/10/24  Yes Boies, Redell DASEN, MD  metoprolol tartrate (LOPRESSOR) 25 MG tablet Take 25 mg by mouth daily.   Yes [provider]  montelukast  (SINGULAIR ) 10 MG tablet TAKE 1 TABLET(10 MG) BY MOUTH AT  BEDTIME 03/02/23  Yes Iva Marty Saltness, MD  olmesartan (BENICAR) 40 MG tablet Take 40 mg by mouth daily.   Yes [provider]  rivaroxaban (XARELTO) 20 MG TABS tablet Take 20 mg by mouth daily with supper.   Yes [provider]  simvastatin (ZOCOR) 20 MG tablet Take 20 mg by mouth every evening.  02/21/15  Yes [provider]  valsartan-hydrochlorothiazide (DIOVAN-HCT) 160-12.5 MG per tablet Take 1 tablet by mouth daily.    Yes [provider]  Azelastine  HCl 0.15 % SOLN Place 1 spray into both nostrils 2 (two) times daily. 08/17/23 09/16/23  Iva Marty Saltness, MD  betamethasone  dipropionate 0.05 % cream Apply topically 2 (two) times daily. 11/24/22   Iva Marty Saltness, MD  buprenorphine (BUTRANS) 5 MCG/HR PTWK Place 1 patch onto the skin once a week.    [provider]  buPROPion  (WELLBUTRIN  XL) 300 MG 24 hr tablet Take 1 tablet (300 mg total) by mouth every morning. 07/05/15 03/04/17  Arfeen, Leni DASEN, MD  dexlansoprazole (DEXILANT) 60 MG capsule Take 60 mg by mouth daily.    [provider]  fluocinonide cream (LIDEX) 0.05 % Apply 1 application topically 2 (two) times daily as needed (skin irritation).  02/15/17   [provider]  fluticasone  (FLONASE ) 50 MCG/ACT nasal spray Place 1 spray into both nostrils in the morning and at bedtime. 08/17/23   Iva Marty Saltness, MD  HYDROcodone-acetaminophen  (NORCO/VICODIN) 5-325 MG per tablet Take 1 tablet by mouth every 6 (six) hours as needed for moderate pain.     [provider]  methocarbamol  (ROBAXIN ) 500 MG tablet Take 1 tablet (500 mg total) by mouth 2 (two) times daily. 07/10/16   Nasario Moats, PA-C  nystatin (MYCOSTATIN) 100000 UNIT/ML suspension Take 5 mLs by mouth 2 (two) times daily.    [provider]  PRESCRIPTION MEDICATION Pt has CPAP machine    [provider]    Family History Family History  Problem Relation Age of Onset   Schizophrenia  Brother    Schizophrenia Brother    Breast cancer Mother    Heart attack Mother    Bipolar disorder Cousin        Maternal side    Depression Maternal Aunt     Social History Social History   Tobacco Use   Smoking status: Never    Passive exposure: Never   Smokeless tobacco: Never  Vaping Use   Vaping status: Never Used  Substance Use Topics   Alcohol use: No    Alcohol/week: 0.0 standard drinks of alcohol   Drug use: No     Allergies   Amlodipine besylate, Atorvastatin, Dust mite extract, Other, Rosuvastatin, and Tramadol   Review of Systems Review of Systems  Musculoskeletal:  Positive for arthralgias and joint swelling. Negative for gait problem.  Neurological:  Positive  for numbness.  All other systems reviewed and are negative.    Physical Exam Triage Vital Signs ED Triage Vitals  Encounter Vitals Group     BP 08/09/24 1519 127/84     Girls Systolic BP Percentile --      Girls Diastolic BP Percentile --      Boys Systolic BP Percentile --      Boys Diastolic BP Percentile --      Pulse Rate 08/09/24 1519 67     Resp 08/09/24 1519 18     Temp 08/09/24 1519 99 F (37.2 C)     Temp Source 08/09/24 1519 Oral     SpO2 08/09/24 1519 95 %     Weight --      Height --      Head Circumference --      Peak Flow --      Pain Score 08/09/24 1523 6     Pain Loc --      Pain Education --      Exclude from Growth Chart --    No data found.  Updated Vital Signs BP 127/84 (BP Location: Left Arm)   Pulse 67   Temp 99 F (37.2 C) (Oral)   Resp 18   SpO2 95%   Visual Acuity Right Eye Distance:   Left Eye Distance:   Bilateral Distance:    Right Eye Near:   Left Eye Near:    Bilateral Near:     Physical Exam Vitals reviewed.  Constitutional:      General: She is awake. She is not in acute distress.    Appearance: Normal appearance. She is well-developed. She is not ill-appearing, toxic-appearing or diaphoretic.  HENT:     Head: Normocephalic.      Right Ear: Hearing normal.     Left Ear: Hearing normal.     Nose: Nose normal.     Mouth/Throat:     Mouth: Mucous membranes are moist.  Eyes:     General: Vision grossly intact.     Conjunctiva/sclera: Conjunctivae normal.  Cardiovascular:     Rate and Rhythm: Normal rate and regular rhythm.     Pulses:          Dorsalis pedis pulses are 2+ on the left side.     Heart sounds: Normal heart sounds.  Pulmonary:     Effort: Pulmonary effort is normal.     Breath sounds: Normal breath sounds and air entry.  Musculoskeletal:     Cervical back: Full passive range of motion without pain, normal range of motion and neck supple.     Left foot: Decreased range of motion. No deformity.       Feet:  Feet:     Left foot:     Skin integrity: Skin integrity normal.     Toenail Condition: Left toenails are normal.     Comments: Pain is noted in the left great toe with radiation to the dorsal aspect of the foot. Swelling is present at the proximal toe extending to the mid-dorsal region of the left foot. Skin integrity remains intact with no obvious deformities. Range of motion of the great toe is decreased secondary to pain. Pedal pulses are intact, and no focal neurological deficits are observed. Skin:    General: Skin is warm and dry.  Neurological:     General: No focal deficit present.     Mental Status: She is alert and oriented to person, place, and time.  Sensory: Sensation is intact. No sensory deficit.     Comments: Limp-like gait due to left toe pain.  Psychiatric:        Speech: Speech normal.        Behavior: Behavior is cooperative.      UC Treatments / Results  Labs (all labs ordered are listed, but only abnormal results are displayed) Labs Reviewed - No data to display  EKG   Radiology DG Foot Complete Left Result Date: 08/09/2024 CLINICAL DATA:  dropped a heavy glass bowl on left great toe around 12 pm today. pai and swelling to the toe which radiates into the  dorsal aspect of the midfoot EXAM: LEFT FOOT - COMPLETE 3+ VIEW COMPARISON:  None Available. FINDINGS: Acute nondisplaced fracture of the medial aspect of the base of first digit proximal phalanx. No dislocation. Mid foot degenerative changes. Subcutaneus soft tissue edema of IMPRESSION: Acute nondisplaced fracture of the medial aspect of the base of first digit proximal phalanx. Electronically Signed   By: Morgane  Naveau M.D.   On: 08/09/2024 17:29    Procedures Procedures (including critical care time)  Medications Ordered in UC Medications  acetaminophen  (TYLENOL ) tablet 975 mg (975 mg Oral Given 08/09/24 1720)    Initial Impression / Assessment and Plan / UC Course  I have reviewed the triage vital signs and the nursing notes.  Pertinent labs & imaging results that were available during my care of the patient were reviewed by me and considered in my medical decision making (see chart for details).     The patient reports dropping a heavy glass bowl on her left toe earlier today and has since experienced intermittent pain and numbness, worse with movement of the toe. Exam shows swelling extending from the toe into the foot. She denies a history of diabetes or neuropathy and had not attempted treatment prior to arrival. X-ray of the left foot demonstrates a nondisplaced fracture at the medial base of the proximal phalanx of the great toe. She was placed in a postoperative shoe and instructed to apply ice to the area several times daily, taking care not to apply ice directly to the skin. Acetaminophen  975 mg was administered in clinic, and she was counseled on appropriate home dosing of acetaminophen  1000 mg every 6 hours for pain control, as NSAIDs are contraindicated due to her chronic anticoagulation therapy. She was advised to bear weight as tolerated, follow up with orthopedics for definitive management, and seek emergency care if she develops severe uncontrolled pain, discoloration of the  toe, loss of sensation, or inability to move the foot.  Today's evaluation has revealed no signs of a dangerous process. Discussed diagnosis with patient and/or guardian. Patient and/or guardian aware of their diagnosis, possible red flag symptoms to watch out for and need for close follow up. Patient and/or guardian understands verbal and written discharge instructions. Patient and/or guardian comfortable with plan and disposition.  Patient and/or guardian has a clear mental status at this time, good insight into illness (after discussion and teaching) and has clear judgment to make decisions regarding their care  Documentation was completed with the aid of voice recognition software. Transcription may contain typographical errors. Final Clinical Impressions(s) / UC Diagnoses   Final diagnoses:  Acute pain of left foot  Closed displaced fracture of proximal phalanx of left great toe, initial encounter     Discharge Instructions      You were seen today after injuring your left foot when a heavy glass bowl  fell on your toe. An X-ray showed a nondisplaced fracture of the base of your big toe. The bone is broken but remains in good alignment, so surgery is not needed at this time. Your foot has been placed in a postoperative shoe to protect the toe and help with healing.  At home, you may bear weight as tolerated while using the postop shoe. Apply ice packs to the foot several times a day for 15-20 minutes at a time, but do not place ice directly on your skin--wrap it in a towel first. You were given acetaminophen  in the clinic for pain relief. Since you cannot take NSAIDs due to your anticoagulation medicine, you should take acetaminophen  (Tylenol ) up to 1000 mg every 6 hours as needed for pain, not exceeding 4000 mg in a 24-hour period.  This can be achieved by taking 2 of the 500 mg tablets.  Elevating your foot when resting can also help reduce swelling and discomfort.  You will need to follow  up with an orthopedic specialist for ongoing care and to make sure the fracture heals properly. Contact their office to schedule an appointment.   Go to the emergency department right away if you develop severe or worsening pain that does not improve with medication, increasing swelling or bruising, new numbness or tingling, inability to move your toe, or if your toe or foot becomes cold, pale, or turns blue.     ED Prescriptions   None    PDMP not reviewed this encounter.   Iola Lukes, [place] 08/09/24 1807

## 2024-08-09 NOTE — Discharge Instructions (Addendum)
 You were seen today after injuring your left foot when a heavy glass bowl fell on your toe. An X-ray showed a nondisplaced fracture of the base of your big toe. The bone is broken but remains in good alignment, so surgery is not needed at this time. Your foot has been placed in a postoperative shoe to protect the toe and help with healing.  At home, you may bear weight as tolerated while using the postop shoe. Apply ice packs to the foot several times a day for 15-20 minutes at a time, but do not place ice directly on your skin--wrap it in a towel first. You were given acetaminophen  in the clinic for pain relief. Since you cannot take NSAIDs due to your anticoagulation medicine, you should take acetaminophen  (Tylenol ) up to 1000 mg every 6 hours as needed for pain, not exceeding 4000 mg in a 24-hour period.  This can be achieved by taking 2 of the 500 mg tablets.  Elevating your foot when resting can also help reduce swelling and discomfort.  You will need to follow up with an orthopedic specialist for ongoing care and to make sure the fracture heals properly. Contact their office to schedule an appointment.   Go to the emergency department right away if you develop severe or worsening pain that does not improve with medication, increasing swelling or bruising, new numbness or tingling, inability to move your toe, or if your toe or foot becomes cold, pale, or turns blue.

## 2024-08-11 DIAGNOSIS — S92415A Nondisplaced fracture of proximal phalanx of left great toe, initial encounter for closed fracture: Secondary | ICD-10-CM | POA: Diagnosis not present

## 2024-08-17 ENCOUNTER — Ambulatory Visit (INDEPENDENT_AMBULATORY_CARE_PROVIDER_SITE_OTHER): Payer: 59 | Admitting: Allergy & Immunology

## 2024-08-17 ENCOUNTER — Ambulatory Visit: Admitting: Physical Therapy

## 2024-08-17 ENCOUNTER — Encounter: Payer: Self-pay | Admitting: Allergy & Immunology

## 2024-08-17 ENCOUNTER — Other Ambulatory Visit: Payer: Self-pay

## 2024-08-17 VITALS — BP 120/72 | HR 71 | Temp 98.0°F | Resp 18 | Ht 66.93 in | Wt 212.1 lb

## 2024-08-17 DIAGNOSIS — J3089 Other allergic rhinitis: Secondary | ICD-10-CM | POA: Diagnosis not present

## 2024-08-17 DIAGNOSIS — K219 Gastro-esophageal reflux disease without esophagitis: Secondary | ICD-10-CM

## 2024-08-17 DIAGNOSIS — J302 Other seasonal allergic rhinitis: Secondary | ICD-10-CM | POA: Diagnosis not present

## 2024-08-17 DIAGNOSIS — L2089 Other atopic dermatitis: Secondary | ICD-10-CM

## 2024-08-17 MED ORDER — AZELASTINE HCL 0.15 % NA SOLN: 1.0000 | Freq: Two times a day (BID) | NASAL | 3 refills | Status: AC

## 2024-08-17 MED ORDER — BETAMETHASONE DIPROPIONATE 0.05 % EX CREA: TOPICAL_CREAM | Freq: Two times a day (BID) | CUTANEOUS | 3 refills | Status: AC

## 2024-08-17 MED ORDER — DEXLANSOPRAZOLE 60 MG PO CPDR: 60.0000 mg | DELAYED_RELEASE_CAPSULE | Freq: Every day | ORAL | 3 refills | Status: AC

## 2024-08-17 MED ORDER — CETIRIZINE HCL 10 MG PO TABS: 10.0000 mg | ORAL_TABLET | Freq: Every day | ORAL | 3 refills | Status: AC

## 2024-08-17 MED ORDER — FLUOCINONIDE 0.05 % EX CREA: 1.0000 | TOPICAL_CREAM | Freq: Two times a day (BID) | CUTANEOUS | 2 refills | Status: AC | PRN

## 2024-08-17 MED ORDER — FLUTICASONE PROPIONATE 50 MCG/ACT NA SUSP: 1.0000 | Freq: Two times a day (BID) | NASAL | 3 refills | Status: AC

## 2024-08-17 NOTE — Progress Notes (Signed)
 FOLLOW UP  Date of Service/Encounter:  08/17/24   Assessment:   Seasonal and perennial allergic rhinitis (grasses, ragweed, weeds, indoor molds, outdoor molds, dust mites, and cockroach)   Flexural atopic dermatitis   GERD - controlled with Dexilant     Plan/Recommendations:   1. Seasonal and perennial allergic rhinitis - Previous testing showed: grasses, ragweed, weeds, indoor molds, outdoor molds, dust mites, and cockroach. - Continue with: Zyrtec  (cetirizine ) 10mg  tablet once daily - Continue with: fluticasone  one spray per nostril twice daily and azelastine  one spray per nostril twice daily - You can use an extra dose of the antihistamine, if needed, for breakthrough symptoms.  - Consider nasal saline rinses 1-2 times daily to remove allergens from the nasal cavities as well as help with mucous clearance (this is especially helpful to do before the nasal sprays are given) - I would consider allergy  shots, which are curative for environmental allergies.  2. Flexural atopic dermatitis - Skin looks fairly good. - Call us  if you need refills of creams.   3. Return in about 6 months (around 02/14/2025). You can have the follow up appointment with Dr. Iva or a Nurse Practicioner (our Nurse Practitioners are excellent and always have Physician oversight!).   Subjective:   Leocadia Idleman is a 68 y.o. female presenting today for follow up of  Chief Complaint  Patient presents with   Follow-up    Charlann Takyia Sindt has a history of the following: Patient Active Problem List   Diagnosis Date Noted   Allergic rhinitis 02/05/2022   Allergic rhinitis due to pollen 02/05/2022   Aortic arch atherosclerosis (HCC) 02/05/2022   Chronic kidney disease, stage 2 (mild) 02/05/2022   Chronic pain syndrome 02/05/2022   Degeneration of lumbar intervertebral disc 02/05/2022   Deep vein thrombosis (DVT) of both lower extremities (HCC) 02/05/2022   Family history of malignant neoplasm  of digestive organs 02/05/2022   Gastroesophageal reflux disease 02/05/2022   Gout 02/05/2022   History of squamous cell carcinoma of skin 02/05/2022   Long term (current) use of anticoagulants 02/05/2022   Major depression in partial remission (HCC) 02/05/2022   Memory deficit 02/05/2022   Moderate recurrent major depression (HCC) 02/05/2022   Obstructive sleep apnea syndrome 02/05/2022   Osteoarthritis 02/05/2022   Parageusia 02/05/2022   Pure hypercholesterolemia 02/05/2022   Essential hypertension 04/25/2009   LOW BACK PAIN, ACUTE 04/25/2009   DVT, HX OF 04/25/2009    History obtained from: chart review and patient.  Discussed the use of AI scribe software for clinical note transcription with the patient and/or guardian, who gave verbal consent to proceed.  Fidelia is a 68 y.o. female presenting for a follow up visit.  She was last seen in September 2024.  At that time, she continue with Zyrtec  and Flonase  for her environmental allergies.  Atopic dermatitis was under good control with moisturizing.  She also was on Dexilant  for her GERD.  Since last visit, she has done well.  Allergic Rhinitis Symptom History: Her allergies are well-controlled indoors, attributed to changes in her home environment, such as the removal of carpet. She uses Flonase  and [place] nasal sprays as needed and occasionally takes cetirizine  for relief.  She did get her carpeting removed in her home after we helped her with this letter.  She is very thankful for that and feels that her symptoms have improved.  Skin Symptom History: Her medication regimen includes betamethasone  cream for managing skin breakouts primarily when outside. She has a  history of using fluocinolide cream and requests a refill.  GERD Symptom History: She has a history of heartburn, currently well-controlled with Dexilant  without the need for additional medications like Tums.  Infection Symptom History: She avoids going outside frequently  due to allergies, which sometimes lead to sinus infections approximately twice a year, requiring antibiotic treatment.  She is retired, having previously worked in [employer], and misses her work and interactions with the children.   Otherwise, there have been no changes to her past medical history, surgical history, family history, or social history.    Review of systems otherwise negative other than that mentioned in the HPI.    Objective:   Blood pressure 120/72, pulse 71, temperature 98 F (36.7 C), temperature source Temporal, resp. rate 18, height 5' 6.93 (1.7 m), weight 212 lb 1.6 oz (96.2 kg), SpO2 97%. Body mass index is 33.29 kg/m.    Physical Exam Vitals reviewed.  Constitutional:      Appearance: She is well-developed.     Comments: Pleasant female.  Cooperative with the exam.  HENT:     Head: Normocephalic and atraumatic.     Right Ear: Tympanic membrane, ear canal and external ear normal. No drainage, swelling or tenderness. Tympanic membrane is not injected, scarred, erythematous, retracted or bulging.     Left Ear: Tympanic membrane, ear canal and external ear normal. No drainage, swelling or tenderness. Tympanic membrane is not injected, scarred, erythematous, retracted or bulging.     Nose: No nasal deformity, septal deviation, mucosal edema or rhinorrhea.     Right Turbinates: Enlarged, swollen and pale.     Left Turbinates: Enlarged, swollen and pale.     Right Sinus: No maxillary sinus tenderness or frontal sinus tenderness.     Left Sinus: No maxillary sinus tenderness or frontal sinus tenderness.     Comments: No polyps.    Mouth/Throat:     Mouth: Mucous membranes are not pale and not dry.     Pharynx: Uvula midline.  Eyes:     General: Lids are normal. Allergic shiner present.        Right eye: No discharge.        Left eye: No discharge.     Conjunctiva/sclera: Conjunctivae normal.     Right eye: Right conjunctiva is not injected. No  chemosis.    Left eye: Left conjunctiva is not injected. No chemosis.    Pupils: Pupils are equal, round, and reactive to light.  Cardiovascular:     Rate and Rhythm: Normal rate and regular rhythm.     Heart sounds: Normal heart sounds.  Pulmonary:     Effort: Pulmonary effort is normal. No tachypnea, accessory muscle usage or respiratory distress.     Breath sounds: Normal breath sounds. No wheezing, rhonchi or rales.     Comments: Moving air well in all lung fields.  No increased work of breathing. Chest:     Chest wall: No tenderness.  Lymphadenopathy:     Head:     Right side of head: No submandibular, tonsillar or occipital adenopathy.     Left side of head: No submandibular, tonsillar or occipital adenopathy.     Cervical: No cervical adenopathy.  Skin:    General: Skin is warm.     Capillary Refill: Capillary refill takes less than 2 seconds.     Coloration: Skin is not pale.     Findings: No abrasion, erythema, petechiae or rash. Rash is not papular, urticarial or vesicular.  Comments: She does have some excoriations present.   Neurological:     Mental Status: She is alert.  Psychiatric:        Behavior: Behavior is cooperative.      Diagnostic studies: none       Marty Shaggy, MD  [facility] 

## 2024-08-17 NOTE — Patient Instructions (Addendum)
 1. Seasonal and perennial allergic rhinitis - Previous testing showed: grasses, ragweed, weeds, indoor molds, outdoor molds, dust mites, and cockroach. - Continue with: Zyrtec  (cetirizine ) 10mg  tablet once daily - Continue with: fluticasone  one spray per nostril twice daily and azelastine  one spray per nostril twice daily - You can use an extra dose of the antihistamine, if needed, for breakthrough symptoms.  - Consider nasal saline rinses 1-2 times daily to remove allergens from the nasal cavities as well as help with mucous clearance (this is especially helpful to do before the nasal sprays are given) - I would consider allergy  shots, which are curative for environmental allergies.  2. Flexural atopic dermatitis - Skin looks fairly good. - Call us  if you need refills of creams.   3. Return in about 6 months (around 02/14/2025). You can have the follow up appointment with Dr. Iva or a Nurse Practicioner (our Nurse Practitioners are excellent and always have Physician oversight!).    Please inform us  of any Emergency Department visits, hospitalizations, or changes in symptoms. Call us  before going to the ED for breathing or allergy  symptoms since we might be able to fit you in for a sick visit. Feel free to contact us  anytime with any questions, problems, or concerns.  It was a pleasure to see you today!  Websites that have reliable patient information: 1. American Academy of Asthma, Allergy , and Immunology: www.aaaai.org 2. Food Allergy  Research and Education (FARE): foodallergy.org 3. Mothers of Asthmatics: http://www.asthmacommunitynetwork.org 4. American College of Allergy , Asthma, and Immunology: www.acaai.org      "Like" us  on Facebook and Instagram for our latest updates!      A healthy democracy works best when [employer] participate! Make sure you are registered to vote! If you have moved or changed any of your contact information, you will need to get this updated  before voting! Scan the QR codes below to learn more!       Allergy  Shots  Allergies are the result of a chain reaction that starts in the immune system. Your immune system controls how your body defends itself. For instance, if you have an allergy  to pollen, your immune system identifies pollen as an invader or allergen. Your immune system overreacts by producing antibodies called Immunoglobulin E (IgE). These antibodies travel to cells that release chemicals, causing an allergic reaction.  The concept behind allergy  immunotherapy, whether it is received in the form of shots or tablets, is that the immune system can be desensitized to specific allergens that trigger allergy  symptoms. Although it requires time and patience, the payback can be long-term relief. Allergy  injections contain a dilute solution of those substances that you are allergic to based upon your skin testing and allergy  history.   How Do Allergy  Shots Work?  Allergy  shots work much like a vaccine. Your body responds to injected amounts of a particular allergen given in increasing doses, eventually developing a resistance and tolerance to it. Allergy  shots can lead to decreased, minimal or no allergy  symptoms.  There generally are two phases: build-up and maintenance. Build-up often ranges from three to six months and involves receiving injections with increasing amounts of the allergens. The shots are typically given once or twice a week, though more rapid build-up schedules are sometimes used.  The maintenance phase begins when the most effective dose is reached. This dose is different for each person, depending on how allergic you are and your response to the build-up injections. Once the maintenance dose is reached, there  are longer periods between injections, typically two to four weeks.  Occasionally doctors give cortisone-type shots that can temporarily reduce allergy  symptoms. These types of shots are different and  should not be confused with allergy  immunotherapy shots.  Who Can Be Treated with Allergy  Shots?  Allergy  shots may be a good treatment approach for people with allergic rhinitis (hay fever), allergic asthma, conjunctivitis (eye allergy ) or stinging insect allergy .   Before deciding to begin allergy  shots, you should consider:   The length of allergy  season and the severity of your symptoms  Whether medications and/or changes to your environment can control your symptoms  Your desire to avoid long-term medication use  Time: allergy  immunotherapy requires a major time commitment  Cost: may vary depending on your insurance coverage  Allergy  shots for children age 70 and older are effective and often well tolerated. They might prevent the onset of new allergen sensitivities or the progression to asthma.  Allergy  shots are not started on patients who are pregnant but can be continued on patients who become pregnant while receiving them. In some patients with other medical conditions or who take certain common medications, allergy  shots may be of risk. It is important to mention other medications you talk to your allergist.   What are the two types of build-ups offered:   RUSH or Rapid Desensitization -- one day of injections lasting from 8:30-4:30pm, injections every 1 hour.  Approximately half of the build-up process is completed in that one day.  The following week, normal build-up is resumed, and this entails ~16 visits either weekly or twice weekly, until reaching your "maintenance dose" which is continued weekly until eventually getting spaced out to every month for a duration of 3 to 5 years. The regular build-up appointments are nurse visits where the injections are administered, followed by required monitoring for 30 minutes.    Traditional build-up -- weekly visits for 6 -12 months until reaching "maintenance dose", then continue weekly until eventually spacing out to every 4 weeks as  above. At these appointments, the injections are administered, followed by required monitoring for 30 minutes.     Either way is acceptable, and both are equally effective. With the rush protocol, the advantage is that less time is spent here for injections overall AND you would also reach maintenance dosing faster (which is when the clinical benefit starts to become more apparent). Not everyone is a candidate for rapid desensitization.   IF we proceed with the RUSH protocol, there are premedications which must be taken the day before and the day after the rush only (this includes antihistamines, steroids, and Singulair ).  After the rush day, no prednisone or Singulair  is required, and we just recommend antihistamines taken on your injection day.  What Is An Estimate of the Costs?  If you are interested in starting allergy  injections, please check with your insurance company about your coverage for both allergy  vial sets and allergy  injections.  Please do so prior to making the appointment to start injections.  The following are CPT codes to give to your insurance company. These are the amounts we BILL to the insurance company, but the amount YOU WILL PAY and WE RECEIVE IS SUBSTANTIALLY LESS and depends on the contracts we have with different insurance companies.   Amount Billed to Insurance One allergy  vial set  CPT 95165   $ 1200     Two allergy  vial set  CPT 95165   $ 2400     Three  allergy  vial set  CPT 95165   $ 3600     One injection   CPT 95115   $ 35  Two injections   CPT 95117   $ 40 RUSH (Rapid Desensitization) CPT 95180 x 8 hours  $500/hour  Regarding the allergy  injections, your co-pay may or may not apply with each injection, so please confirm this with your insurance company. When you start allergy  injections, 1 or 2 sets of vials are made based on your allergies.  Not all patients can be on one set of vials. A set of vials lasts 6 months to a year depending on how quickly you can  proceed with your build-up of your allergy  injections. Vials are personalized for each patient depending on their specific allergens.  How often are allergy  injection given during the build-up period?   Injections are given at least weekly during the build-up period until your maintenance dose is achieved. Per the doctor's discretion, you may have the option of getting allergy  injections two times per week during the build-up period. However, there must be at least 48 hours between injections. The build-up period is usually completed within 6-12 months depending on your ability to schedule injections and for adjustments for reactions. When maintenance dose is reached, your injection schedule is gradually changed to every two weeks and later to every three weeks. Injections will then continue every 4 weeks. Usually, injections are continued for a total of 3-5 years.   When Will I Feel Better?  Some may experience decreased allergy  symptoms during the build-up phase. For others, it may take as long as 12 months on the maintenance dose. If there is no improvement after a year of maintenance, your allergist will discuss other treatment options with you.  If you aren't responding to allergy  shots, it may be because there is not enough dose of the allergen in your vaccine or there are missing allergens that were not identified during your allergy  testing. Other reasons could be that there are high levels of the allergen in your environment or major exposure to non-allergic triggers like tobacco smoke.  What Is the Length of Treatment?  Once the maintenance dose is reached, allergy  shots are generally continued for three to five years. The decision to stop should be discussed with your allergist at that time. Some people may experience a permanent reduction of allergy  symptoms. Others may relapse and a longer course of allergy  shots can be considered.  What Are the Possible Reactions?  The two types of  adverse reactions that can occur with allergy  shots are local and systemic. Common local reactions include very mild redness and swelling at the injection site, which can happen immediately or several hours after. Report a delayed reaction from your last injection. These include arm swelling or runny nose, watery eyes or cough that occurs within 12-24 hours after injection. A systemic reaction, which is less common, affects the entire body or a particular body system. They are usually mild and typically respond quickly to medications. Signs include increased allergy  symptoms such as sneezing, a stuffy nose or hives.   Rarely, a serious systemic reaction called anaphylaxis can develop. Symptoms include swelling in the throat, wheezing, a feeling of tightness in the chest, nausea or dizziness. Most serious systemic reactions develop within 30 minutes of allergy  shots. This is why it is strongly recommended you wait in your doctor's office for 30 minutes after your injections. Your allergist is trained to watch for reactions, and his  or her staff is trained and equipped with the proper medications to identify and treat them.   Report to the nurse immediately if you experience any of the following symptoms: swelling, itching or redness of the skin, hives, watery eyes/nose, breathing difficulty, excessive sneezing, coughing, stomach pain, diarrhea, or light headedness. These symptoms may occur within 15-20 minutes after injection and may require medication.   Who Should Administer Allergy  Shots?  The preferred location for receiving shots is your prescribing allergist's office. Injections can sometimes be given at another facility where the physician and staff are trained to recognize and treat reactions, and have received instructions by your prescribing allergist.  What if I am late for an injection?   Injection dose will be adjusted depending upon how many days or weeks you are late for your injection.    What if I am sick?   Please report any illness to the nurse before receiving injections. She may adjust your dose or postpone injections depending on your symptoms. If you have fever, flu, sinus infection or chest congestion it is best to postpone allergy  injections until you are better. Never get an allergy  injection if your asthma is causing you problems. If your symptoms persist, seek out medical care to get your health problem under control.  What If I am or Become Pregnant:  Women that become pregnant should schedule an appointment with [facility] before receiving any further allergy  injections.

## 2024-08-21 DIAGNOSIS — G4733 Obstructive sleep apnea (adult) (pediatric): Secondary | ICD-10-CM | POA: Diagnosis not present

## 2024-08-30 DIAGNOSIS — S92415A Nondisplaced fracture of proximal phalanx of left great toe, initial encounter for closed fracture: Secondary | ICD-10-CM | POA: Diagnosis not present

## 2024-08-30 DIAGNOSIS — M21621 Bunionette of right foot: Secondary | ICD-10-CM | POA: Diagnosis not present

## 2024-09-14 DIAGNOSIS — L821 Other seborrheic keratosis: Secondary | ICD-10-CM | POA: Diagnosis not present

## 2024-09-14 DIAGNOSIS — D225 Melanocytic nevi of trunk: Secondary | ICD-10-CM | POA: Diagnosis not present

## 2024-09-14 DIAGNOSIS — L814 Other melanin hyperpigmentation: Secondary | ICD-10-CM | POA: Diagnosis not present

## 2024-09-14 DIAGNOSIS — R202 Paresthesia of skin: Secondary | ICD-10-CM | POA: Diagnosis not present

## 2024-09-20 DIAGNOSIS — S92415D Nondisplaced fracture of proximal phalanx of left great toe, subsequent encounter for fracture with routine healing: Secondary | ICD-10-CM | POA: Diagnosis not present

## 2024-09-26 DIAGNOSIS — Z23 Encounter for immunization: Secondary | ICD-10-CM | POA: Diagnosis not present

## 2024-09-26 DIAGNOSIS — M519 Unspecified thoracic, thoracolumbar and lumbosacral intervertebral disc disorder: Secondary | ICD-10-CM | POA: Diagnosis not present

## 2024-09-26 DIAGNOSIS — M109 Gout, unspecified: Secondary | ICD-10-CM | POA: Diagnosis not present

## 2024-09-26 DIAGNOSIS — G4733 Obstructive sleep apnea (adult) (pediatric): Secondary | ICD-10-CM | POA: Diagnosis not present

## 2024-09-26 DIAGNOSIS — R7303 Prediabetes: Secondary | ICD-10-CM | POA: Diagnosis not present

## 2024-09-26 DIAGNOSIS — I10 Essential (primary) hypertension: Secondary | ICD-10-CM | POA: Diagnosis not present

## 2024-09-26 DIAGNOSIS — G894 Chronic pain syndrome: Secondary | ICD-10-CM | POA: Diagnosis not present

## 2024-09-26 DIAGNOSIS — E7800 Pure hypercholesterolemia, unspecified: Secondary | ICD-10-CM | POA: Diagnosis not present

## 2024-09-26 DIAGNOSIS — Z0000 Encounter for general adult medical examination without abnormal findings: Secondary | ICD-10-CM | POA: Diagnosis not present

## 2024-09-26 DIAGNOSIS — I491 Atrial premature depolarization: Secondary | ICD-10-CM | POA: Diagnosis not present

## 2024-09-26 DIAGNOSIS — N1831 Chronic kidney disease, stage 3a: Secondary | ICD-10-CM | POA: Diagnosis not present

## 2024-10-01 DIAGNOSIS — G4733 Obstructive sleep apnea (adult) (pediatric): Secondary | ICD-10-CM | POA: Diagnosis not present

## 2024-10-06 ENCOUNTER — Ambulatory Visit: Admitting: Pain Medicine

## 2024-10-17 ENCOUNTER — Other Ambulatory Visit: Payer: Self-pay | Admitting: Internal Medicine

## 2024-10-17 DIAGNOSIS — Z1231 Encounter for screening mammogram for malignant neoplasm of breast: Secondary | ICD-10-CM

## 2024-11-14 ENCOUNTER — Ambulatory Visit
Admission: RE | Admit: 2024-11-14 | Discharge: 2024-11-14 | Disposition: A | Source: Ambulatory Visit | Attending: Internal Medicine | Admitting: Internal Medicine

## 2024-11-14 DIAGNOSIS — Z1231 Encounter for screening mammogram for malignant neoplasm of breast: Secondary | ICD-10-CM

## 2025-02-13 ENCOUNTER — Ambulatory Visit: Admitting: Allergy & Immunology
